# Patient Record
Sex: Female | Born: 1980
Health system: Southern US, Community
[De-identification: ages and names within clinical notes are randomized; demographics above are authoritative.]

## PROBLEM LIST (undated history)

## (undated) DIAGNOSIS — I509 Heart failure, unspecified: Secondary | ICD-10-CM

## (undated) DIAGNOSIS — G43909 Migraine, unspecified, not intractable, without status migrainosus: Secondary | ICD-10-CM

## (undated) DIAGNOSIS — T7840XA Allergy, unspecified, initial encounter: Secondary | ICD-10-CM

## (undated) DIAGNOSIS — I10 Essential (primary) hypertension: Secondary | ICD-10-CM

## (undated) DIAGNOSIS — D649 Anemia, unspecified: Secondary | ICD-10-CM

## (undated) HISTORY — PX: APPENDECTOMY: SHX54

## (undated) HISTORY — DX: Essential (primary) hypertension: I10

## (undated) HISTORY — DX: Allergy, unspecified, initial encounter: T78.40XA

## (undated) HISTORY — DX: Anemia, unspecified: D64.9

## (undated) HISTORY — PX: TUBAL LIGATION: SHX77

---
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
## 2000-01-25 ENCOUNTER — Emergency Department (HOSPITAL_COMMUNITY): Admission: EM | Admit: 2000-01-25 | Discharge: 2000-01-25 | Payer: Self-pay | Admitting: Emergency Medicine

## 2000-09-11 ENCOUNTER — Emergency Department (HOSPITAL_COMMUNITY): Admission: EM | Admit: 2000-09-11 | Discharge: 2000-09-11 | Payer: Self-pay | Admitting: Emergency Medicine

## 2002-11-25 ENCOUNTER — Emergency Department (HOSPITAL_COMMUNITY): Admission: EM | Admit: 2002-11-25 | Discharge: 2002-11-25 | Payer: Self-pay | Admitting: Emergency Medicine

## 2003-03-26 ENCOUNTER — Emergency Department (HOSPITAL_COMMUNITY): Admission: EM | Admit: 2003-03-26 | Discharge: 2003-03-26 | Payer: Self-pay | Admitting: Emergency Medicine

## 2005-01-18 ENCOUNTER — Emergency Department (HOSPITAL_COMMUNITY): Admission: EM | Admit: 2005-01-18 | Discharge: 2005-01-18 | Payer: Self-pay | Admitting: Emergency Medicine

## 2005-01-31 ENCOUNTER — Inpatient Hospital Stay (HOSPITAL_COMMUNITY): Admission: AD | Admit: 2005-01-31 | Discharge: 2005-01-31 | Payer: Self-pay | Admitting: Obstetrics

## 2005-02-19 ENCOUNTER — Inpatient Hospital Stay (HOSPITAL_COMMUNITY): Admission: AD | Admit: 2005-02-19 | Discharge: 2005-02-21 | Payer: Self-pay | Admitting: Obstetrics

## 2005-03-02 ENCOUNTER — Inpatient Hospital Stay (HOSPITAL_COMMUNITY): Admission: AD | Admit: 2005-03-02 | Discharge: 2005-03-07 | Payer: Self-pay | Admitting: Obstetrics

## 2006-05-14 DIAGNOSIS — I509 Heart failure, unspecified: Secondary | ICD-10-CM | POA: Insufficient documentation

## 2006-05-14 HISTORY — DX: Heart failure, unspecified: I50.9

## 2006-09-04 ENCOUNTER — Inpatient Hospital Stay (HOSPITAL_COMMUNITY): Admission: AD | Admit: 2006-09-04 | Discharge: 2006-09-04 | Payer: Self-pay | Admitting: Family Medicine

## 2006-09-05 ENCOUNTER — Ambulatory Visit: Payer: Self-pay | Admitting: Family Medicine

## 2006-09-05 ENCOUNTER — Encounter: Payer: Self-pay | Admitting: Family Medicine

## 2006-09-06 ENCOUNTER — Ambulatory Visit (HOSPITAL_COMMUNITY): Admission: RE | Admit: 2006-09-06 | Discharge: 2006-09-06 | Payer: Self-pay | Admitting: Family Medicine

## 2006-09-18 ENCOUNTER — Inpatient Hospital Stay (HOSPITAL_COMMUNITY): Admission: AD | Admit: 2006-09-18 | Discharge: 2006-09-21 | Payer: Self-pay | Admitting: Obstetrics and Gynecology

## 2006-09-20 ENCOUNTER — Encounter: Payer: Self-pay | Admitting: Gynecology

## 2006-09-26 ENCOUNTER — Ambulatory Visit: Payer: Self-pay | Admitting: Obstetrics & Gynecology

## 2010-04-22 ENCOUNTER — Emergency Department (HOSPITAL_COMMUNITY)
Admission: EM | Admit: 2010-04-22 | Discharge: 2010-04-22 | Payer: Self-pay | Source: Home / Self Care | Admitting: Emergency Medicine

## 2010-07-24 LAB — RAPID STREP SCREEN (MED CTR MEBANE ONLY): Streptococcus, Group A Screen (Direct): POSITIVE — AB

## 2010-09-26 NOTE — Discharge Summary (Signed)
NAME:  Jennifer Andrews, Jennifer Andrews               ACCOUNT NO.:  [id number]   MEDICAL RECORD NO.:  [id number]          PATIENT TYPE:  INP   LOCATION:                                FACILITY:  [facility]   PHYSICIAN:  Phil D. Okey Dupre, M.D.     DATE OF BIRTH:  [birth year]   DATE OF ADMISSION:  09/18/2006  DATE OF DISCHARGE:                               DISCHARGE SUMMARY   ATTENDING PHYSICIAN:  Phil D. Okey Dupre, M.D.   HISTORY OF PRESENT ILLNESS:  The patient is a 30 year old gravida 3,  para 2-0-0-2 at 20 weeks and 5 days with reports of severe lower  abdominal pain and difficulty urinating.  The patient was one week  status post an amniocentesis at the time of admission.  The patient also  reported episodes of diarrhea four times a day x1 week, and the patient  was admitted for IV hydration, pain management and further evaluation of  abdominal pain.  Renal ultrasound on May 7 appeared to be within normal  limits, and all of the patient's admission labs also appeared to be  within normal limits.  Amylase and lipase were slightly elevated at the  time of admission.  Lipase was 96 and amylase was 133.  Vital signs  remained stable and the patient was afebrile.  The patient did undergo  an MRI on hospital day #2 which revealed no significant abdominal or  pelvic findings per Dr. Loraine Leriche __________, radiologist.  The patient's  status improved significantly over the course of her hospital stay, and  she was discharged on hospital day #3, Sep 21, 2006, with improved  status and tolerating regular diet, denying nausea, vomiting and no  abdominal pain.   DISCHARGE DIAGNOSES:  A 30 year old gravida 3, para 2-0-0-2 at 21-2/7  weeks with abdominal pain of unknown etiology.  The patient was  discharged home and is going to continue to be followed by social work  and DSS secondary to living in a homeless shelter with her two other  children and will be followed up by GI and will continue her prenatal  care at [facility].   Was instructed to follow up as scheduled at  [facility] for her routine prenatal care.  OB/GYN office to call  patient with details of GI Outpatient follow up.      Maylon Cos, C.N.M.      Phil D. Okey Dupre, M.D.  Electronically Signed    SS/MEDQ  D:  11/19/2006  T:  11/20/2006  Job:  [id number]

## 2010-09-29 NOTE — Discharge Summary (Signed)
NAME:  Jennifer Andrews, Jennifer Andrews NO.:  [id number]   MEDICAL RECORD NO.:  [id number]          PATIENT TYPE:  INP   LOCATION:  9305                          FACILITY:  [facility]   PHYSICIAN:  Kathreen Cosier, M.D.DATE OF BIRTH:  1981-04-09   DATE OF ADMISSION:  03/02/2005  DATE OF DISCHARGE:  03/07/2005                                 DISCHARGE SUMMARY   Patient is a 30 year old gravida 2, para 1-0-0-1, EDC Sep 12, 2005 with a  third hospitalization where she was hospitalized October 9 to October 11  because of nausea, vomiting and was on steroid taper.  She finished her  steroid taper, now readmitted for the same hyperemesis.  On admission  hemoglobin was 14.2, white count 12.6, sodium 139, potassium 4, chloride  100, creatinine 0.6, glucose 93.  She was started on Ensure twice a day.  She tolerated this well and she also was started on a steroid taper.  Patient rapidly improved and by March 07, 2005 was discharged home on  Medrol steroid taper, Phenergan, and Pepcid, to see me in two weeks.   DISCHARGE DIAGNOSES:  Status post hospitalization for hyperemesis.           ______________________________  Kathreen Cosier, M.D.     BAM/MEDQ  D:  03/28/2005  T:  03/28/2005  Job:  [id number]

## 2010-09-29 NOTE — Discharge Summary (Signed)
Jennifer Andrews, FERGER NO.:  [id number]   MEDICAL RECORD NO.:  [id number]          PATIENT TYPE:  INP   LOCATION:  9311                          [facility]ACILITY:  [facility]   PHYSICIAN:  Kathreen Cosier, M.D.DATE OF BIRTH:  November 17, 1980   DATE OF ADMISSION:  02/19/2005  DATE OF DISCHARGE:  02/21/2005                                 DISCHARGE SUMMARY   Patient is a 30 year old gravida 2, para 1-0-0-1, EDC Sep 12, 2005.  Was  admitted with nausea, vomiting of pregnancy.  States that she vomited some  blood and had epigastric burning and pain.  Ketones were 40, protein 30 in  her urine.  Patient was started on IV antibiotics and IV fluids and her SMA-  24 was normal.  She had a urinary tract infection and was treated with  Macrobid twice a day.  She was also given Pepcid 20 mg daily which relieved  her symptoms.  She was started on Medrol steroid taper and rapidly felt  better.  She was discharged home on February 21, 2005 on the steroid taper  and Pepcid 20 mg p.o. daily and Macrobid b.i.d. for urinary tract infection  to see me in two weeks.   DISCHARGE DIAGNOSES:  Status post admission for hyperemesis.           ______________________________  Kathreen Cosier, M.D.     BAM/MEDQ  D:  03/28/2005  T:  03/28/2005  Job:  [id number]

## 2013-04-11 ENCOUNTER — Encounter (HOSPITAL_COMMUNITY): Payer: Self-pay | Admitting: Emergency Medicine

## 2013-04-11 ENCOUNTER — Emergency Department (HOSPITAL_COMMUNITY): Payer: Medicaid - Out of State

## 2013-04-11 ENCOUNTER — Emergency Department (HOSPITAL_COMMUNITY)
Admission: EM | Admit: 2013-04-11 | Discharge: 2013-04-11 | Disposition: A | Discharge status: Home / Self Care | Payer: Medicaid - Out of State | Attending: Emergency Medicine | Admitting: Emergency Medicine

## 2013-04-11 DIAGNOSIS — Z3202 Encounter for pregnancy test, result negative: Secondary | ICD-10-CM | POA: Insufficient documentation

## 2013-04-11 DIAGNOSIS — J22 Unspecified acute lower respiratory infection: Secondary | ICD-10-CM

## 2013-04-11 DIAGNOSIS — I509 Heart failure, unspecified: Secondary | ICD-10-CM | POA: Insufficient documentation

## 2013-04-11 DIAGNOSIS — N390 Urinary tract infection, site not specified: Secondary | ICD-10-CM

## 2013-04-11 DIAGNOSIS — F17200 Nicotine dependence, unspecified, uncomplicated: Secondary | ICD-10-CM | POA: Insufficient documentation

## 2013-04-11 DIAGNOSIS — R042 Hemoptysis: Secondary | ICD-10-CM | POA: Insufficient documentation

## 2013-04-11 DIAGNOSIS — J00 Acute nasopharyngitis [common cold]: Secondary | ICD-10-CM | POA: Insufficient documentation

## 2013-04-11 DIAGNOSIS — R630 Anorexia: Secondary | ICD-10-CM | POA: Insufficient documentation

## 2013-04-11 DIAGNOSIS — R52 Pain, unspecified: Secondary | ICD-10-CM | POA: Insufficient documentation

## 2013-04-11 HISTORY — DX: Heart failure, unspecified: I50.9

## 2013-04-11 LAB — URINALYSIS, ROUTINE W REFLEX MICROSCOPIC
Bilirubin Urine: NEGATIVE
Glucose, UA: NEGATIVE mg/dL
Hgb urine dipstick: NEGATIVE
Ketones, ur: NEGATIVE mg/dL
Nitrite: NEGATIVE
Protein, ur: NEGATIVE mg/dL
Specific Gravity, Urine: 1.028 (ref 1.005–1.030)
Urobilinogen, UA: 0.2 mg/dL (ref 0.0–1.0)
pH: 6.5 (ref 5.0–8.0)

## 2013-04-11 LAB — CBC WITH DIFFERENTIAL/PLATELET
Basophils Absolute: 0 10*3/uL (ref 0.0–0.1)
Basophils Relative: 0 % (ref 0–1)
Eosinophils Absolute: 0.2 10*3/uL (ref 0.0–0.7)
Eosinophils Relative: 2 % (ref 0–5)
HCT: 38.1 % (ref 36.0–46.0)
Hemoglobin: 13 g/dL (ref 12.0–15.0)
Lymphocytes Relative: 22 % (ref 12–46)
Lymphs Abs: 2.1 10*3/uL (ref 0.7–4.0)
MCH: 32.1 pg (ref 26.0–34.0)
MCHC: 34.1 g/dL (ref 30.0–36.0)
MCV: 94.1 fL (ref 78.0–100.0)
Monocytes Absolute: 0.7 10*3/uL (ref 0.1–1.0)
Monocytes Relative: 7 % (ref 3–12)
Neutro Abs: 6.7 10*3/uL (ref 1.7–7.7)
Neutrophils Relative %: 69 % (ref 43–77)
Platelets: 249 10*3/uL (ref 150–400)
RBC: 4.05 MIL/uL (ref 3.87–5.11)
RDW: 13.6 % (ref 11.5–15.5)
WBC: 9.7 10*3/uL (ref 4.0–10.5)

## 2013-04-11 LAB — BASIC METABOLIC PANEL
BUN: 8 mg/dL (ref 6–23)
CO2: 25 mEq/L (ref 19–32)
Calcium: 8.6 mg/dL (ref 8.4–10.5)
Chloride: 102 mEq/L (ref 96–112)
Creatinine, Ser: 0.65 mg/dL (ref 0.50–1.10)
GFR calc Af Amer: >90 mL/min (ref >90)
GFR calc non Af Amer: >90 mL/min (ref >90)
Glucose, Bld: 84 mg/dL (ref 70–99)
Potassium: 3.8 mEq/L (ref 3.5–5.1)
Sodium: 135 mEq/L (ref 135–145)

## 2013-04-11 LAB — PROTIME-INR
INR: 1.01 (ref 0.00–1.49)
Prothrombin Time: 13.1 seconds (ref 11.6–15.2)

## 2013-04-11 LAB — RAPID STREP SCREEN (MED CTR MEBANE ONLY): Streptococcus, Group A Screen (Direct): NEGATIVE

## 2013-04-11 LAB — URINE MICROSCOPIC-ADD ON

## 2013-04-11 LAB — POCT PREGNANCY, URINE: Preg Test, Ur: NEGATIVE

## 2013-04-11 MED ORDER — AMOXICILLIN 500 MG PO CAPS: 500.0000 mg | ORAL_CAPSULE | Freq: Three times a day (TID) | ORAL | Status: DC

## 2013-04-11 MED ORDER — HYDROCODONE-HOMATROPINE 5-1.5 MG/5ML PO SYRP: 5.0000 mL | ORAL_SOLUTION | Freq: Four times a day (QID) | ORAL | Status: DC | PRN

## 2013-04-11 MED ORDER — ACETAMINOPHEN 325 MG PO TABS
650.0000 mg | ORAL_TABLET | Freq: Once | ORAL | Status: AC
  Administered 2013-04-11: 650 mg via ORAL
  Filled 2013-04-11: qty 2

## 2013-04-11 NOTE — ED Notes (Signed)
Pt states that she has been having sore throat, body aches, and "spitting up big clots of blood and mucus".  When asked if they were streaks of blood or clots of blood, pt reiterated that they were "big clots" of blood.

## 2013-04-11 NOTE — ED Provider Notes (Signed)
CSN: [id number]     Arrival date & time 04/11/13  1100 History   First MD Initiated Contact with Patient 04/11/13 1110     Chief Complaint  Patient presents with  . Sore Throat  . Generalized Body Aches   (Consider location/radiation/quality/duration/timing/severity/associated sxs/prior Treatment) HPI This is a 32 year old female who presents emergency Department with chief complaint of sore throat, cough, hemoptysis, and myalgias.  Patient states that since Tuesday she has had generalized body aches, sore throat, decreased appetite, pain with cough, myalgias, and "coughing up chunks of blood."  She denies a hx of blood clots or pulmonary embolism.  She denies a family history of coagulopathy.  She denies any use of exogenous estrogens, recent confinement, surgery, unilateral leg swelling.  The patient is a current smoker.. States she had hemoptysis previously with a diagnosis of pneumonia.  She states that this is similar to her last episode of pneumonia.  The patient has been using TheraFlu at home without significant relief of her symptoms.  She denies fevers.  Past Medical History  Diagnosis Date  . CHF (congestive heart failure)    Past Surgical History  Procedure Laterality Date  . Cesarean section    . Appendectomy     History reviewed. No pertinent family history. History  Substance Use Topics  . Smoking status: Current Every Day Smoker  . Smokeless tobacco: Not on file  . Alcohol Use: No   OB History   Grav Para Term Preterm Abortions TAB SAB Ect Mult Living                 Review of Systems  Ten systems reviewed and are negative for acute change, except as noted in the HPI.   Allergies  Review of patient's allergies indicates no known allergies.  Home Medications   Current Outpatient Rx  Name  Route  Sig  Dispense  Refill  . Pheniramine-PE-APAP (THERAFLU COLD & SORE THROAT) 20-10-325 MG PACK   Oral   Take 1 packet by mouth every 6 (six) hours as needed (cold  symptoms).          BP 127/81  Pulse 103  Temp(Src) 98.1 F (36.7 C) (Oral)  Resp 16  SpO2 99%  LMP 03/21/2013 Physical Exam Appears moderately ill but not toxic; temperature as noted in vitals. Ears normal. Eyes:glassy appearance, no discharge  Heart: RRR, NO M/G/R Throat and pharynx normal.   Neck supple. No adenopathyhy in the neck.  Sinuses non tender.  The chest is clear. Abdomen is soft and nontender.  ED Course  Procedures (including critical care time) Labs Review Labs Reviewed  RAPID STREP SCREEN  CULTURE, GROUP A STREP  CBC WITH DIFFERENTIAL  BASIC METABOLIC PANEL  PROTIME-INR  URINALYSIS, ROUTINE W REFLEX MICROSCOPIC  POCT PREGNANCY, URINE   Imaging Review No results found.  EKG Interpretation   None       MDM   1. Chest cold    12:26 PM BP 127/81  Pulse 103  Temp(Src) 98.1 F (36.7 C) (Oral)  Resp 16  SpO2 99%  LMP 03/21/2013 Pt CXR negative for acute infiltrate. Patients symptoms are consistent with URI, likely viral etiology. Discussed that antibiotics are not indicated for viral infections. Pt will be discharged with symptomatic treatment.  Verbalizes understanding and is agreeable with plan. Pt is hemodynamically stable & in NAD prior to dc.   1:05 PM Patient UA positive for UTI. Will treat with amoxil b/c of dual respiratory coverage. The patient appears reasonably  screened and/or stabilized for discharge and I doubt any other medical condition or other [facility] requiring further screening, evaluation, or treatment in the ED at this time prior to discharge.    Arthor Captain, PA-C 04/12/13 [phone]

## 2013-04-13 LAB — URINE CULTURE: Colony Count: 80000

## 2013-04-13 LAB — CULTURE, GROUP A STREP

## 2013-04-13 NOTE — ED Provider Notes (Signed)
Medical screening examination/treatment/procedure(s) were performed by non-physician practitioner and as supervising physician I was immediately available for consultation/collaboration.  EKG Interpretation   None         Xabi Wittler, MD 04/13/13 0731 

## 2013-04-14 ENCOUNTER — Telehealth (HOSPITAL_COMMUNITY): Payer: Self-pay | Admitting: Emergency Medicine

## 2013-04-14 NOTE — ED Notes (Signed)
Post ED Visit - Positive Culture Follow-up  Culture report reviewed by antimicrobial stewardship pharmacist: []  Wes Dulaney, Pharm.D., BCPS [x]  Celedonio Miyamoto, Pharm.D., BCPS []  Georgina Pillion, Pharm.D., BCPS []  [place], [address].D., BCPS, AAHIVP []  Estella Husk, Pharm.D., BCPS, AAHIVP  Positive strep culture Treated with Amoxicillin, organism sensitive to the same and no further patient follow-up is required at this time.  Kylie A Holland 04/14/2013, 1:45 PM

## 2013-04-20 ENCOUNTER — Encounter (HOSPITAL_COMMUNITY): Payer: Self-pay | Admitting: Emergency Medicine

## 2013-04-20 ENCOUNTER — Emergency Department (HOSPITAL_COMMUNITY)
Admission: EM | Admit: 2013-04-20 | Discharge: 2013-04-20 | Disposition: A | Discharge status: Home / Self Care | Payer: Medicaid - Out of State | Attending: Emergency Medicine | Admitting: Emergency Medicine

## 2013-04-20 DIAGNOSIS — IMO0001 Reserved for inherently not codable concepts without codable children: Secondary | ICD-10-CM | POA: Insufficient documentation

## 2013-04-20 DIAGNOSIS — Z3202 Encounter for pregnancy test, result negative: Secondary | ICD-10-CM | POA: Insufficient documentation

## 2013-04-20 DIAGNOSIS — R197 Diarrhea, unspecified: Secondary | ICD-10-CM | POA: Insufficient documentation

## 2013-04-20 DIAGNOSIS — M129 Arthropathy, unspecified: Secondary | ICD-10-CM | POA: Insufficient documentation

## 2013-04-20 DIAGNOSIS — I509 Heart failure, unspecified: Secondary | ICD-10-CM | POA: Insufficient documentation

## 2013-04-20 DIAGNOSIS — K226 Gastro-esophageal laceration-hemorrhage syndrome: Secondary | ICD-10-CM

## 2013-04-20 DIAGNOSIS — K921 Melena: Secondary | ICD-10-CM | POA: Insufficient documentation

## 2013-04-20 DIAGNOSIS — F17200 Nicotine dependence, unspecified, uncomplicated: Secondary | ICD-10-CM | POA: Insufficient documentation

## 2013-04-20 DIAGNOSIS — R112 Nausea with vomiting, unspecified: Secondary | ICD-10-CM | POA: Insufficient documentation

## 2013-04-20 DIAGNOSIS — K625 Hemorrhage of anus and rectum: Secondary | ICD-10-CM | POA: Insufficient documentation

## 2013-04-20 LAB — URINALYSIS, ROUTINE W REFLEX MICROSCOPIC
Bilirubin Urine: NEGATIVE
Glucose, UA: NEGATIVE mg/dL
Leukocytes, UA: NEGATIVE
Nitrite: NEGATIVE
Specific Gravity, Urine: 1.026 (ref 1.005–1.030)
Urobilinogen, UA: 1 mg/dL (ref 0.0–1.0)
pH: 6 (ref 5.0–8.0)

## 2013-04-20 LAB — CBC WITH DIFFERENTIAL/PLATELET
Basophils Absolute: 0 10*3/uL (ref 0.0–0.1)
Basophils Relative: 0 % (ref 0–1)
Hemoglobin: 13.9 g/dL (ref 12.0–15.0)
MCHC: 34.2 g/dL (ref 30.0–36.0)
Monocytes Relative: 4 % (ref 3–12)
Neutro Abs: 8.6 10*3/uL — ABNORMAL HIGH (ref 1.7–7.7)
Neutrophils Relative %: 86 % — ABNORMAL HIGH (ref 43–77)
RBC: 4.36 MIL/uL (ref 3.87–5.11)

## 2013-04-20 LAB — BASIC METABOLIC PANEL
BUN: 10 mg/dL (ref 6–23)
CO2: 22 mEq/L (ref 19–32)
Chloride: 100 mEq/L (ref 96–112)
Creatinine, Ser: 0.72 mg/dL (ref 0.50–1.10)
GFR calc Af Amer: >90 mL/min (ref >90)
Glucose, Bld: 92 mg/dL (ref 70–99)
Potassium: 3.9 mEq/L (ref 3.5–5.1)

## 2013-04-20 LAB — OCCULT BLOOD, POC DEVICE: Fecal Occult Bld: NEGATIVE

## 2013-04-20 LAB — CK: Total CK: 200 U/L — ABNORMAL HIGH (ref 7–177)

## 2013-04-20 LAB — POCT PREGNANCY, URINE: Preg Test, Ur: NEGATIVE

## 2013-04-20 MED ORDER — KETOROLAC TROMETHAMINE 30 MG/ML IJ SOLN
30.0000 mg | Freq: Once | INTRAMUSCULAR | Status: AC
  Administered 2013-04-20: 30 mg via INTRAVENOUS
  Filled 2013-04-20: qty 1

## 2013-04-20 MED ORDER — SODIUM CHLORIDE 0.9 % IV BOLUS (SEPSIS)
1000.0000 mL | Freq: Once | INTRAVENOUS | Status: AC
  Administered 2013-04-20: 1000 mL via INTRAVENOUS

## 2013-04-20 MED ORDER — FAMOTIDINE IN NACL 20-0.9 MG/50ML-% IV SOLN
20.0000 mg | Freq: Once | INTRAVENOUS | Status: AC
Start: 2013-04-20 — End: 2013-04-20
  Administered 2013-04-20: 20 mg via INTRAVENOUS
  Filled 2013-04-20: qty 50

## 2013-04-20 MED ORDER — MORPHINE SULFATE 4 MG/ML IJ SOLN
4.0000 mg | Freq: Once | INTRAMUSCULAR | Status: AC
  Administered 2013-04-20: 4 mg via INTRAVENOUS
  Filled 2013-04-20: qty 1

## 2013-04-20 MED ORDER — IBUPROFEN 400 MG PO TABS: 400.0000 mg | ORAL_TABLET | Freq: Four times a day (QID) | ORAL | Status: DC | PRN

## 2013-04-20 MED ORDER — HYDROCODONE-ACETAMINOPHEN 5-325 MG PO TABS: 1.0000 | ORAL_TABLET | Freq: Four times a day (QID) | ORAL | Status: DC | PRN

## 2013-04-20 MED ORDER — ONDANSETRON 8 MG PO TBDP: 8.0000 mg | ORAL_TABLET | Freq: Three times a day (TID) | ORAL | Status: DC | PRN

## 2013-04-20 NOTE — ED Notes (Signed)
Patient willl call when she has to urinate

## 2013-04-20 NOTE — ED Notes (Signed)
MD at bedside. 

## 2013-04-20 NOTE — ED Notes (Addendum)
patient reports body aches, N/V/D and states she had a bright red clot in her emesis. Patient also states she had a burgundy-colored blood that was in the stool. Patient c/o bilateral feet swelling.

## 2013-04-20 NOTE — Progress Notes (Signed)
[facility] provided pt with a list of primary care resources. Patient stated pending Medicaid.  °

## 2013-04-20 NOTE — ED Notes (Signed)
Patient has tried to urinate several times and can not.

## 2013-04-20 NOTE — ED Provider Notes (Signed)
CSN: [id number]     Arrival date & time 04/20/13  0820 History   First MD Initiated Contact with Patient 04/20/13 [phone]     Chief Complaint  Patient presents with  . Generalized Body Aches  . Emesis  . Rectal Bleeding   (Consider location/radiation/quality/duration/timing/severity/associated sxs/prior Treatment) HPI Comments: Pt with no medical hx comes in with cc of diffuse body aches and emesis, diarrhea, with blood in it. Pt reports feeling well until last evening, when she suddenly started having pain all over her body. She subsequently has had about 2-3 episodes of loose BM and emesis - with the emesis having some blood in it, and possibly her stool having blood in it. She has no hx of liver dz, gastritis - and no risk factors for gastritis or alcohol abuse. No fevers, chill, no uri like sx, no new meds, drug use, supplemental drug use. No recent strenuous activities.  Patient is a 32 y.o. female presenting with vomiting and hematochezia. The history is provided by the patient.  Emesis Associated symptoms: arthralgias, diarrhea and myalgias   Associated symptoms: no abdominal pain and no headaches   Rectal Bleeding Associated symptoms: vomiting   Associated symptoms: no abdominal pain and no fever     Past Medical History  Diagnosis Date  . CHF (congestive heart failure)    Past Surgical History  Procedure Laterality Date  . Cesarean section    . Appendectomy     Family History  Problem Relation Age of Onset  . Asthma Mother   . Stroke Father   . Diabetes Father   . Heart failure Father    History  Substance Use Topics  . Smoking status: Current Some Day Smoker    Types: Cigars  . Smokeless tobacco: Never Used  . Alcohol Use: No   OB History   Grav Para Term Preterm Abortions TAB SAB Ect Mult Living                 Review of Systems  Constitutional: Positive for activity change. Negative for fever.  Respiratory: Negative for shortness of breath.    Cardiovascular: Negative for chest pain.  Gastrointestinal: Positive for nausea, vomiting, diarrhea, blood in stool and hematochezia. Negative for abdominal pain.  Genitourinary: Negative for dysuria.  Musculoskeletal: Positive for arthralgias, back pain and myalgias. Negative for neck pain.  Neurological: Negative for headaches.    Allergies  Review of patient's allergies indicates no known allergies.  Home Medications  No current outpatient prescriptions on file. BP 118/67  Pulse 71  Temp(Src) 99 F (37.2 C) (Oral)  Resp 19  SpO2 99%  LMP 03/21/2013 Physical Exam  Nursing note and vitals reviewed. Constitutional: She is oriented to person, place, and time. She appears well-developed.  HENT:  Head: Normocephalic and atraumatic.  Eyes: Conjunctivae and EOM are normal. Pupils are equal, round, and reactive to light.  Neck: Normal range of motion. Neck supple.  Cardiovascular: Normal rate, regular rhythm and normal heart sounds.   Pulmonary/Chest: Effort normal and breath sounds normal. No respiratory distress. She exhibits tenderness.  Abdominal: Soft. Bowel sounds are normal. She exhibits no distension. There is tenderness. There is no rebound and no guarding.  Musculoskeletal: She exhibits tenderness.  Neurological: She is alert and oriented to person, place, and time.  Skin: Skin is warm and dry.    ED Course  Procedures (including critical care time) Labs Review Labs Reviewed  CBC WITH DIFFERENTIAL - Abnormal; Notable for the following:  Neutrophils Relative % 86 (*)    Neutro Abs 8.6 (*)    Lymphocytes Relative 10 (*)    All other components within normal limits  BASIC METABOLIC PANEL - Abnormal; Notable for the following:    Sodium 132 (*)    All other components within normal limits  CK - Abnormal; Notable for the following:    Total CK 200 (*)    All other components within normal limits  URINALYSIS, ROUTINE W REFLEX MICROSCOPIC  OCCULT BLOOD, POC DEVICE    Imaging Review No results found.  EKG Interpretation   None       MDM  No diagnosis found.  Pt comes in with cc of diffuse body aches, n/v/d.  Pt is hemoccult neg, no melena, and there has been no emesis in the ER. Hb is stable. There is no risk factor for gastritis. Will give GI f/u if the sx persist, she can see them.  Pt has diffuse body aches and malaise, with no uri like sx. CK is slightly elevated - so possible some myositis. Likely some viral syndrome, causing diffuse aches. Her vitals are stable and she has 0 SIRs criteria. We have hydrated her in the ER and feel that she can go home, get some rest, get fluids and probably will get better overtime. Pt to return to the ER if the sx get worse.   Derwood Kaplan, MD 04/20/13 1208

## 2013-09-01 ENCOUNTER — Emergency Department (HOSPITAL_COMMUNITY)
Admission: EM | Admit: 2013-09-01 | Discharge: 2013-09-02 | Disposition: A | Discharge status: Home / Self Care | Payer: Medicaid Other | Attending: Emergency Medicine | Admitting: Emergency Medicine

## 2013-09-01 ENCOUNTER — Encounter (HOSPITAL_COMMUNITY): Payer: Self-pay | Admitting: Emergency Medicine

## 2013-09-01 DIAGNOSIS — R519 Headache, unspecified: Secondary | ICD-10-CM

## 2013-09-01 DIAGNOSIS — I509 Heart failure, unspecified: Secondary | ICD-10-CM | POA: Insufficient documentation

## 2013-09-01 DIAGNOSIS — R51 Headache: Secondary | ICD-10-CM

## 2013-09-01 DIAGNOSIS — F17200 Nicotine dependence, unspecified, uncomplicated: Secondary | ICD-10-CM | POA: Insufficient documentation

## 2013-09-01 DIAGNOSIS — G43909 Migraine, unspecified, not intractable, without status migrainosus: Secondary | ICD-10-CM | POA: Insufficient documentation

## 2013-09-01 HISTORY — DX: Migraine, unspecified, not intractable, without status migrainosus: G43.909

## 2013-09-01 MED ORDER — DIPHENHYDRAMINE HCL 50 MG/ML IJ SOLN
25.0000 mg | Freq: Once | INTRAMUSCULAR | Status: AC
  Administered 2013-09-02: 25 mg via INTRAVENOUS
  Filled 2013-09-01: qty 1

## 2013-09-01 MED ORDER — KETOROLAC TROMETHAMINE 30 MG/ML IJ SOLN
30.0000 mg | Freq: Once | INTRAMUSCULAR | Status: AC
  Administered 2013-09-02: 30 mg via INTRAVENOUS
  Filled 2013-09-01: qty 2
  Filled 2013-09-01: qty 1

## 2013-09-01 MED ORDER — METOCLOPRAMIDE HCL 5 MG/ML IJ SOLN
10.0000 mg | Freq: Once | INTRAMUSCULAR | Status: AC
  Administered 2013-09-02: 10 mg via INTRAVENOUS
  Filled 2013-09-01: qty 2

## 2013-09-01 NOTE — ED Notes (Signed)
Pt states that she began having a migraine around 3pm today; pt states that she is nauseous with the migraine; pt states that she has had migraines in the past; pt denies visual changes, photophobia or any other associated symptoms.

## 2013-09-02 NOTE — ED Provider Notes (Signed)
CSN: [id number]     Arrival date & time 09/01/13  2146 History   First MD Initiated Contact with Patient 09/01/13 2310     Chief Complaint  Patient presents with  . Migraine     (Consider location/radiation/quality/duration/timing/severity/associated sxs/prior Treatment) Patient is a 33 y.o. female presenting with migraines. The history is provided by the patient and medical records.  Migraine Associated symptoms include headaches.   This is a 33 year old female with past medical history significant for congestive heart failure and migraines, presenting to the ED for migraine headaches. States headache started at approximately 1500 while picking up her children from school has been progressively worsening throughout the day. Headache described as a generalized throbbing sensation across her for head associated with photophobia and nausea. Patient denies vomiting. No associated aura, tinnitus, visual disturbance, confusion, changes in speech, unilateral weakness, numbness, paresthesias, or difficulty ambulating.  No fevers, chills, or neck pain.  Patient was previously followed by a neurologist as a teenager, but has not seen one in several years. She is now currently on maintenance medications for migraines. She did take Excedrin Migraine prior to arrival without significant improvement.  VS stable on arrival.    Past Medical History  Diagnosis Date  . CHF (congestive heart failure)   . Migraines    Past Surgical History  Procedure Laterality Date  . Cesarean section    . Appendectomy     Family History  Problem Relation Age of Onset  . Asthma Mother   . Stroke Father   . Diabetes Father   . Heart failure Father    History  Substance Use Topics  . Smoking status: Current Some Day Smoker    Types: Cigars  . Smokeless tobacco: Never Used  . Alcohol Use: No   OB History   Grav Para Term Preterm Abortions TAB SAB Ect Mult Living                 Review of Systems  Eyes:  Positive for photophobia.  Neurological: Positive for headaches.  All other systems reviewed and are negative.     Allergies  Review of patient's allergies indicates no known allergies.  Home Medications   Prior to Admission medications   Medication Sig Start Date End Date Taking? Authorizing Provider  aspirin-acetaminophen-caffeine (EXCEDRIN MIGRAINE) [id number] 0654250-250-65 MG per tablet Take 1 tablet by mouth every 6 (six) hours as needed for headache.   Yes Historical Provider, MD   BP 119/72  Pulse 74  Temp(Src) 97.6 F (36.4 C) (Oral)  Resp 20  Ht 5' (1.524 m)  Wt 218 lb (98.884 kg)  BMI 42.58 kg/m2  SpO2 100%  LMP 08/17/2013  Physical Exam  Nursing note and vitals reviewed. Constitutional: She is oriented to person, place, and time. She appears well-developed and well-nourished. No distress.  Lying in dark room, hands over forehead and eyes  HENT:  Head: Normocephalic and atraumatic.  Mouth/Throat: Oropharynx is clear and moist.  Eyes: Conjunctivae and EOM are normal. Pupils are equal, round, and reactive to light.  Neck: Normal range of motion and full passive range of motion without pain. Neck supple. No rigidity. No edema present.  No meningeal signs; full ROM without pain  Cardiovascular: Normal rate, regular rhythm and normal heart sounds.   Pulmonary/Chest: Effort normal and breath sounds normal. No respiratory distress. She has no wheezes.  Musculoskeletal: Normal range of motion.  Neurological: She is alert and oriented to person, place, and time. She has normal strength. She displays  no tremor. No cranial nerve deficit or sensory deficit. She displays no seizure activity.  AAOx3, answering questions and following commands appropriately; equal strength UE and LE bilaterally; CN grossly intact; moves all extremities appropriately without ataxia; no focal neuro deficits or facial asymmetry appreciated  Skin: Skin is warm and dry. She is not diaphoretic.  Psychiatric: She  has a normal mood and affect.    ED Course  Procedures (including critical care time) Labs Review Labs Reviewed - No data to display  Imaging Review No results found.   EKG Interpretation None      MDM   Final diagnoses:  Headache   33 y.o. F with hx of migraines presenting today with migraine headache.  Associated photophobia and nausea, no vomiting.  No fevers, chills, or neck pain.  Patient is afebrile and overall nontoxic appearing.  Neuro exam is intact and without focal deficit.  Low suspicion for TIA, stroke, ICH, SAH, or meningitis.  Will give migraine cocktail and reassess.  Pt reassessed, currently sleeping, NAD.  Awoke pt, states headache has improved.  Neuro exam remains intact.  Pt discharged home.  Will follow-up with PCP if problems occur.  Discussed plan with pt, she acknowledged understanding and agreed with plan of care.  Return precautions given for new or worsening symptoms.  Garlon HatchetLisa M Rogelio Waynick, PA-C 09/02/13 0111

## 2013-09-02 NOTE — Discharge Instructions (Signed)
Follow-up with your primary care physician. °Return to the ED for new or worsening symptoms. °

## 2013-09-02 NOTE — ED Provider Notes (Signed)
Medical screening examination/treatment/procedure(s) were performed by non-physician practitioner and as supervising physician I was immediately available for consultation/collaboration.   EKG Interpretation None        Courtney F Horton, MD 09/02/13 1859 

## 2014-05-02 ENCOUNTER — Emergency Department (HOSPITAL_COMMUNITY)
Admission: EM | Admit: 2014-05-02 | Discharge: 2014-05-02 | Disposition: A | Discharge status: Home / Self Care | Payer: Medicaid Other | Attending: Emergency Medicine | Admitting: Emergency Medicine

## 2014-05-02 ENCOUNTER — Encounter (HOSPITAL_COMMUNITY): Payer: Self-pay | Admitting: Emergency Medicine

## 2014-05-02 DIAGNOSIS — Y9389 Activity, other specified: Secondary | ICD-10-CM | POA: Diagnosis not present

## 2014-05-02 DIAGNOSIS — Y998 Other external cause status: Secondary | ICD-10-CM | POA: Diagnosis not present

## 2014-05-02 DIAGNOSIS — X58XXXA Exposure to other specified factors, initial encounter: Secondary | ICD-10-CM | POA: Diagnosis not present

## 2014-05-02 DIAGNOSIS — Z87891 Personal history of nicotine dependence: Secondary | ICD-10-CM | POA: Diagnosis not present

## 2014-05-02 DIAGNOSIS — Y9289 Other specified places as the place of occurrence of the external cause: Secondary | ICD-10-CM | POA: Diagnosis not present

## 2014-05-02 DIAGNOSIS — I509 Heart failure, unspecified: Secondary | ICD-10-CM | POA: Diagnosis not present

## 2014-05-02 DIAGNOSIS — R000 Tachycardia, unspecified: Secondary | ICD-10-CM | POA: Insufficient documentation

## 2014-05-02 DIAGNOSIS — G43909 Migraine, unspecified, not intractable, without status migrainosus: Secondary | ICD-10-CM | POA: Insufficient documentation

## 2014-05-02 DIAGNOSIS — S39012A Strain of muscle, fascia and tendon of lower back, initial encounter: Secondary | ICD-10-CM | POA: Diagnosis not present

## 2014-05-02 DIAGNOSIS — S3992XA Unspecified injury of lower back, initial encounter: Secondary | ICD-10-CM | POA: Diagnosis present

## 2014-05-02 MED ORDER — NAPROXEN 375 MG PO TABS: 375.0000 mg | ORAL_TABLET | Freq: Two times a day (BID) | ORAL | Status: DC | PRN

## 2014-05-02 MED ORDER — KETOROLAC TROMETHAMINE 60 MG/2ML IM SOLN
60.0000 mg | Freq: Once | INTRAMUSCULAR | Status: AC
  Administered 2014-05-02: 60 mg via INTRAMUSCULAR
  Filled 2014-05-02: qty 2

## 2014-05-02 MED ORDER — DIAZEPAM 5 MG PO TABS: 5.0000 mg | ORAL_TABLET | Freq: Three times a day (TID) | ORAL | Status: DC | PRN

## 2014-05-02 NOTE — ED Notes (Addendum)
Pt c/o lower back pain and "pulling sensation" to left posterior thigh. Pt denies loss of control of bowel or bladder, denies trauma.

## 2014-05-02 NOTE — ED Provider Notes (Signed)
CSN: [id number]     Arrival date & time 05/02/14  [id number] History   First MD Initiated Contact with Patient 05/02/14 0801     Chief Complaint  Patient presents with  . Back Pain     (Consider location/radiation/quality/duration/timing/severity/associated sxs/prior Treatment) HPI Comments: 33 year old female with congestive heart failure history, very mild per patient not on medicines, no allergies presents with left lower back pain with mild radiation down buttocks. Patient denies urinary or bowel changes, active cancer, extremity weakness, IVDU, fevers, immunosuppression or significant trauma. 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 Symptoms gradually worsening the past 2 days, patient works at [employer] regularly.   Patient is a 33 y.o. female presenting with back pain. The history is provided by the patient.  Back Pain Associated symptoms: no abdominal pain, no dysuria, no fever, no headaches, no numbness and no weakness     Past Medical History  Diagnosis Date  . CHF (congestive heart failure)   . Migraines    Past Surgical History  Procedure Laterality Date  . Cesarean section    . Appendectomy     Family History  Problem Relation Age of Onset  . Asthma Mother   . Stroke Father   . Diabetes Father   . Heart failure Father    History  Substance Use Topics  . Smoking status: Former Smoker    Types: Cigars  . Smokeless tobacco: Never Used  . Alcohol Use: No   OB History    No data available     Review of Systems  Constitutional: Negative for fever and chills.  Eyes: Negative for visual disturbance.  Gastrointestinal: Negative for vomiting and abdominal pain.  Genitourinary: Negative for dysuria and flank pain.  Musculoskeletal: Positive for back pain. Negative for neck pain and neck stiffness.  Skin: Negative for rash.  Neurological: Negative for weakness, light-headedness, numbness and headaches.      Allergies  Review  of patient's allergies indicates no known allergies.  Home Medications   Prior to Admission medications   Medication Sig Start Date End Date Taking? Authorizing Provider  aspirin-acetaminophen-caffeine (EXCEDRIN MIGRAINE) [phone] MG per tablet Take 1 tablet by mouth every 6 (six) hours as needed for headache.    Historical Provider, MD   BP 120/84 mmHg  Pulse 107  Temp(Src) 97.8 F (36.6 C) (Oral)  Resp 18  SpO2 100% Physical Exam  Constitutional: She is oriented to person, place, and time. She appears well-developed and well-nourished.  HENT:  Head: Normocephalic and atraumatic.  Eyes: Right eye exhibits no discharge. Left eye exhibits no discharge.  Cardiovascular: Regular rhythm.  Tachycardia present.   Pulmonary/Chest: Effort normal and breath sounds normal.  Musculoskeletal: She exhibits tenderness. She exhibits no edema.  Patient has paraspinal lower lumbar and sacral tenderness worse with palpation and extension.  Neurological: She is alert and oriented to person, place, and time.  Reflex Scores:      Patellar reflexes are 2+ on the right side and 2+ on the left side.      Achilles reflexes are 2+ on the right side and 2+ on the left side. 5+ strength in  LE with f/e at major joints. Sensation to palpation intact in UE and LE. CNs 2-12 grossly intact.     Skin: Skin is warm. No rash noted.  Psychiatric: She has a normal mood and affect.  Nursing note and vitals reviewed.   ED Course  Procedures (including critical care time)  Labs Review Labs Reviewed - No data to display  Imaging Review No results found.   EKG Interpretation None      MDM   Final diagnoses:  Lumbosacral strain, initial encounter   Patient with minimal medical history, no red flags for back workup presents with clinically muscular skeletal back pain. Discussed supportive care, few days off work, avoiding mechanism that worsens pain, muscle relaxant and pain medicines. Discussed reasons  to return. Patient says she has no issues with NSAIDs, no kidney disease, will give Toradol in ER.  Results and differential diagnosis were discussed with the patient/parent/guardian. Close follow up outpatient was discussed, comfortable with the plan.   Medications  ketorolac (TORADOL) injection 60 mg (not administered)    Filed Vitals:   05/02/14 0804 05/02/14 0805  BP: 120/84   Pulse: 107   Temp:  97.8 F (36.6 C)  TempSrc:  Oral  Resp:  18  SpO2: 100%     Final diagnoses:  Lumbosacral strain, initial encounter       Enid SkeensJoshua M Wes Lezotte, MD 05/02/14 [phone]

## 2014-05-02 NOTE — ED Notes (Signed)
MD at bedside. 

## 2014-05-02 NOTE — Discharge Instructions (Signed)
If you were given medicines take as directed.  If you are on coumadin or contraceptives realize their levels and effectiveness is altered by many different medicines.  If you have any reaction (rash, tongues swelling, other) to the medicines stop taking and see a physician.   Please follow up as directed and return to the ER or see a physician for new or worsening symptoms (weakness, numbness, urinary changes).  Thank you. Filed Vitals:   05/02/14 0804 05/02/14 0805  BP: 120/84   Pulse: 107   Temp:  97.8 F (36.6 C)  TempSrc:  Oral  Resp:  18  SpO2: 100%

## 2014-05-02 NOTE — ED Notes (Signed)
Pt alert, oriented, and ambulatory upon DC. She is aware to follow up with PCP in 1 week regarding physical therapy recommendation. Pt leaves with DC papers in hand.

## 2015-01-12 ENCOUNTER — Encounter (HOSPITAL_COMMUNITY): Payer: Self-pay | Admitting: Emergency Medicine

## 2015-01-12 ENCOUNTER — Emergency Department (HOSPITAL_COMMUNITY)
Admission: EM | Admit: 2015-01-12 | Discharge: 2015-01-13 | Disposition: A | Discharge status: Home / Self Care | Payer: Medicaid Other | Attending: Emergency Medicine | Admitting: Emergency Medicine

## 2015-01-12 DIAGNOSIS — Z79899 Other long term (current) drug therapy: Secondary | ICD-10-CM | POA: Diagnosis not present

## 2015-01-12 DIAGNOSIS — M7989 Other specified soft tissue disorders: Secondary | ICD-10-CM | POA: Diagnosis not present

## 2015-01-12 DIAGNOSIS — Z720 Tobacco use: Secondary | ICD-10-CM | POA: Insufficient documentation

## 2015-01-12 DIAGNOSIS — I509 Heart failure, unspecified: Secondary | ICD-10-CM | POA: Insufficient documentation

## 2015-01-12 DIAGNOSIS — M25561 Pain in right knee: Secondary | ICD-10-CM | POA: Diagnosis not present

## 2015-01-12 DIAGNOSIS — Z8679 Personal history of other diseases of the circulatory system: Secondary | ICD-10-CM | POA: Diagnosis not present

## 2015-01-12 MED ORDER — IBUPROFEN 800 MG PO TABS
800.0000 mg | ORAL_TABLET | Freq: Once | ORAL | Status: DC
  Filled 2015-01-12: qty 1

## 2015-01-12 MED ORDER — MELOXICAM 7.5 MG PO TABS
7.5000 mg | ORAL_TABLET | Freq: Every day | ORAL | Status: DC
Start: 2015-01-12 — End: 2017-01-21

## 2015-01-12 MED ORDER — HYDROCODONE-ACETAMINOPHEN 5-325 MG PO TABS
1.0000 | ORAL_TABLET | Freq: Once | ORAL | Status: AC
  Administered 2015-01-13: 1 via ORAL
  Filled 2015-01-12: qty 1

## 2015-01-12 NOTE — ED Notes (Signed)
Pt c/o right knee pain x1 month, radiating to lower back . Reports numbness and tingling traveling to toes. Ambulatory with steady gait. No relief with tylenol and ibuprofen at home.

## 2015-01-12 NOTE — ED Provider Notes (Signed)
CSN: [id number]     Arrival date & time 01/12/15  2246 History  This chart was scribed for Elpidio Anis, working with Loren Racer, MD by Chestine Spore, ED Scribe. The patient was seen in room WTR6/WTR6 at 11:13 PM.    No chief complaint on file.    The history is provided by the patient. No language interpreter was used.    Jennifer Andrews is a 34 y.o. female who presents to the Emergency Department complaining of progressively worsening right knee pain onset since April, 2016. Pt reports that her right knee pain has been going on for 1 month. Pt has not been informed that she has arthritis in the past. Pt reports that she is able to bear weight but not without aid from her son. Pt is having associated symptoms of right knee swelling with intermittent left knee swelling. She notes that she has tried ibuprofen and tylenol with no relief of her symptoms. She denies color change, wound, rash, and any other symptoms. 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 Denies allergies to medications.   Past Medical History  Diagnosis Date  . CHF (congestive heart failure)   . Migraines    Past Surgical History  Procedure Laterality Date  . Cesarean section    . Appendectomy     Family History  Problem Relation Age of Onset  . Asthma Mother   . Stroke Father   . Diabetes Father   . Heart failure Father    Social History  Substance Use Topics  . Smoking status: Current Some Day Smoker    Types: Cigarettes  . Smokeless tobacco: Never Used  . Alcohol Use: No   OB History    No data available     Review of Systems  Musculoskeletal: Positive for joint swelling and arthralgias. Negative for gait problem.  Skin: Negative for color change, rash and wound.      Allergies  Review of patient's allergies indicates no known allergies.  Home Medications   Prior to Admission medications   Medication Sig Start Date End Date Taking? Authorizing Provider  chlorhexidine  (PERIDEX) 0.12 % solution 15 mLs. Rinse for 30 seconds twice daily after breakfast and before bed. 12/16/14  Yes Historical Provider, MD  ibuprofen (ADVIL,MOTRIN) 800 MG tablet Take 1 tablet by mouth every 6 (six) hours as needed. pain 12/20/14  Yes Historical Provider, MD  traMADol (ULTRAM) 50 MG tablet Take 50 mg by mouth every 6 (six) hours as needed for moderate pain.   Yes Historical Provider, MD  diazepam (VALIUM) 5 MG tablet Take 1 tablet (5 mg total) by mouth every 8 (eight) hours as needed for muscle spasms. Patient not taking: Reported on 01/12/2015 05/02/14   Blane Ohara, MD  naproxen (NAPROSYN) 375 MG tablet Take 1 tablet (375 mg total) by mouth 2 (two) times daily as needed. Patient not taking: Reported on 01/12/2015 05/02/14   Blane Ohara, MD   BP 126/69 mmHg  Pulse 77  Temp(Src) 97.8 F (36.6 C) (Oral)  Resp 18  SpO2 97%  LMP 12/23/2014 Physical Exam  Constitutional: She is oriented to person, place, and time. She appears well-developed and well-nourished. No distress.  HENT:  Head: Normocephalic and atraumatic.  Eyes: EOM are normal.  Neck: Neck supple. No tracheal deviation present.  Cardiovascular: Normal rate and intact distal pulses.   Pulmonary/Chest: Effort normal. No respiratory distress.  Musculoskeletal: Normal range of motion.  Right knee: She exhibits no swelling.  Right knee: exam limited by body habitus bst no significant swelling observed. No redness or warmth. Joint stable. Bilateral meniscal tenderness. No calf or thigh tenderness.   Neurological: She is alert and oriented to person, place, and time.  Skin: Skin is warm and dry.  Psychiatric: She has a normal mood and affect. Her behavior is normal.  Nursing note and vitals reviewed.   ED Course  Procedures (including critical care time) DIAGNOSTIC STUDIES: Oxygen Saturation is 97% on RA, nl by my interpretation.    COORDINATION OF CARE: 11:20 PM Discussed treatment plan with pt at bedside and  pt agreed to plan.   Labs Review Labs Reviewed - No data to display  Imaging Review No results found. Elpidio Anis, PA-C, have personally reviewed and evaluated these images and lab results as part of my medical decision-making.    EKG Interpretation None      MDM   Final diagnoses:  None    1. Right knee pain  Knee joint stable, doubt liagmentous injury. Suspect arthritic pain vs meniscal damage. Knee immobilizer provided, Mobic recommended.   I personally performed the services described in this documentation, which was scribed in my presence. The recorded information has been reviewed and is accurate.     Elpidio Anis, PA-C 01/14/15 2036  Loren Racer, MD 01/16/15 [phone]

## 2015-01-12 NOTE — Discharge Instructions (Signed)
Arthritis, Nonspecific °Arthritis is inflammation of a joint. This usually means pain, redness, warmth or swelling are present. One or more joints may be involved. There are a number of types of arthritis. Your caregiver may not be able to tell what type of arthritis you have right away. °CAUSES  °The most common cause of arthritis is the wear and tear on the joint (osteoarthritis). This causes damage to the cartilage, which can break down over time. The knees, hips, back and neck are most often affected by this type of arthritis. °Other types of arthritis and common causes of joint pain include: °· Sprains and other injuries near the joint. Sometimes minor sprains and injuries cause pain and swelling that develop hours later. °· Rheumatoid arthritis. This affects hands, feet and knees. It usually affects both sides of your body at the same time. It is often associated with chronic ailments, fever, weight loss and general weakness. °· Crystal arthritis. Gout and pseudo gout can cause occasional acute severe pain, redness and swelling in the foot, ankle, or knee. °· Infectious arthritis. Bacteria can get into a joint through a break in overlying skin. This can cause infection of the joint. Bacteria and viruses can also spread through the blood and affect your joints. °· Drug, infectious and allergy reactions. Sometimes joints can become mildly painful and slightly swollen with these types of illnesses. °SYMPTOMS  °· Pain is the main symptom. °· Your joint or joints can also be red, swollen and warm or hot to the touch. °· You may have a fever with certain types of arthritis, or even feel overall ill. °· The joint with arthritis will hurt with movement. Stiffness is present with some types of arthritis. °DIAGNOSIS  °Your caregiver will suspect arthritis based on your description of your symptoms and on your exam. Testing may be needed to find the type of arthritis: °· Blood and sometimes urine tests. °· X-ray tests  and sometimes CT or MRI scans. °· Removal of fluid from the joint (arthrocentesis) is done to check for bacteria, crystals or other causes. Your caregiver (or a specialist) will numb the area over the joint with a local anesthetic, and use a needle to remove joint fluid for examination. This procedure is only minimally uncomfortable. °· Even with these tests, your caregiver may not be able to tell what kind of arthritis you have. Consultation with a specialist (rheumatologist) may be helpful. °TREATMENT  °Your caregiver will discuss with you treatment specific to your type of arthritis. If the specific type cannot be determined, then the following general recommendations may apply. °Treatment of severe joint pain includes: °· Rest. °· Elevation. °· Anti-inflammatory medication (for example, ibuprofen) may be prescribed. Avoiding activities that cause increased pain. °· Only take over-the-counter or prescription medicines for pain and discomfort as recommended by your caregiver. °· Cold packs over an inflamed joint may be used for 10 to 15 minutes every hour. Hot packs sometimes feel better, but do not use overnight. Do not use hot packs if you are diabetic without your caregiver's permission. °· A cortisone shot into arthritic joints may help reduce pain and swelling. °· Any acute arthritis that gets worse over the next 1 to 2 days needs to be looked at to be sure there is no joint infection. °Long-term arthritis treatment involves modifying activities and lifestyle to reduce joint stress jarring. This can include weight loss. Also, exercise is needed to nourish the joint cartilage and remove waste. This helps keep the muscles   around the joint strong. °HOME CARE INSTRUCTIONS  °· Do not take aspirin to relieve pain if gout is suspected. This elevates uric acid levels. °· Only take over-the-counter or prescription medicines for pain, discomfort or fever as directed by your caregiver. °· Rest the joint as much as  possible. °· If your joint is swollen, keep it elevated. °· Use crutches if the painful joint is in your leg. °· Drinking plenty of fluids may help for certain types of arthritis. °· Follow your caregiver's dietary instructions. °· Try low-impact exercise such as: °· Swimming. °· Water aerobics. °· Biking. °· Walking. °· Morning stiffness is often relieved by a warm shower. °· Put your joints through regular range-of-motion. °SEEK MEDICAL CARE IF:  °· You do not feel better in 24 hours or are getting worse. °· You have side effects to medications, or are not getting better with treatment. °SEEK IMMEDIATE MEDICAL CARE IF:  °· You have a fever. °· You develop severe joint pain, swelling or redness. °· Many joints are involved and become painful and swollen. °· There is severe back pain and/or leg weakness. °· You have loss of bowel or bladder control. °Document Released: 06/07/2004 Document Revised: 07/23/2011 Document Reviewed: 06/23/2008 °[employer]® Patient Information ©2015 [employer]. This information is not intended to replace advice given to you by your health care provider. Make sure you discuss any questions you have with your health care provider. ° °Cryotherapy °Cryotherapy means treatment with cold. Ice or gel packs can be used to reduce both pain and swelling. Ice is the most helpful within the first 24 to 48 hours after an injury or flare-up from overusing a muscle or joint. Sprains, strains, spasms, burning pain, shooting pain, and aches can all be eased with ice. Ice can also be used when recovering from surgery. Ice is effective, has very few side effects, and is safe for most people to use. °PRECAUTIONS  °Ice is not a safe treatment option for people with: °· Raynaud phenomenon. This is a condition affecting small blood vessels in the extremities. Exposure to cold may cause your problems to return. °· Cold hypersensitivity. There are many forms of cold hypersensitivity, including: °¨ Cold urticaria.  Red, itchy hives appear on the skin when the tissues begin to warm after being iced. °¨ Cold erythema. This is a red, itchy rash caused by exposure to cold. °¨ Cold hemoglobinuria. Red blood cells break down when the tissues begin to warm after being iced. The hemoglobin that carry oxygen are passed into the urine because they cannot combine with blood proteins fast enough. °· Numbness or altered sensitivity in the area being iced. °If you have any of the following conditions, do not use ice until you have discussed cryotherapy with your caregiver: °· Heart conditions, such as arrhythmia, angina, or chronic heart disease. °· High blood pressure. °· Healing wounds or open skin in the area being iced. °· Current infections. °· Rheumatoid arthritis. °· Poor circulation. °· Diabetes. °Ice slows the blood flow in the region it is applied. This is beneficial when trying to stop inflamed tissues from spreading irritating chemicals to surrounding tissues. However, if you expose your skin to cold temperatures for too long or without the proper protection, you can damage your skin or nerves. Watch for signs of skin damage due to cold. °HOME CARE INSTRUCTIONS °Follow these tips to use ice and cold packs safely. °· Place a dry or damp towel between the ice and skin. A damp towel will   cool the skin more quickly, so you may need to shorten the time that the ice is used. °· For a more rapid response, add gentle compression to the ice. °· Ice for no more than 10 to 20 minutes at a time. The bonier the area you are icing, the less time it will take to get the benefits of ice. °· Check your skin after 5 minutes to make sure there are no signs of a poor response to cold or skin damage. °· Rest 20 minutes or more between uses. °· Once your skin is numb, you can end your treatment. You can test numbness by very lightly touching your skin. The touch should be so light that you do not see the skin dimple from the pressure of your  fingertip. When using ice, most people will feel these normal sensations in this order: cold, burning, aching, and numbness. °· Do not use ice on someone who cannot communicate their responses to pain, such as small children or people with dementia. °HOW TO MAKE AN ICE PACK °Ice packs are the most common way to use ice therapy. Other methods include ice massage, ice baths, and cryosprays. Muscle creams that cause a cold, tingly feeling do not offer the same benefits that ice offers and should not be used as a substitute unless recommended by your caregiver. °To make an ice pack, do one of the following: °· Place crushed ice or a bag of frozen vegetables in a sealable plastic bag. Squeeze out the excess air. Place this bag inside another plastic bag. Slide the bag into a pillowcase or place a damp towel between your skin and the bag. °· Mix 3 parts water with 1 part rubbing alcohol. Freeze the mixture in a sealable plastic bag. When you remove the mixture from the freezer, it will be slushy. Squeeze out the excess air. Place this bag inside another plastic bag. Slide the bag into a pillowcase or place a damp towel between your skin and the bag. °SEEK MEDICAL CARE IF: °· You develop white spots on your skin. This may give the skin a blotchy (mottled) appearance. °· Your skin turns blue or pale. °· Your skin becomes waxy or hard. °· Your swelling gets worse. °MAKE SURE YOU:  °· Understand these instructions. °· Will watch your condition. °· Will get help right away if you are not doing well or get worse. °Document Released: 12/25/2010 Document Revised: 09/14/2013 Document Reviewed: 12/25/2010 °[employer]® Patient Information ©2015 [employer]. This information is not intended to replace advice given to you by your health care provider. Make sure you discuss any questions you have with your health care provider. ° °

## 2015-03-09 ENCOUNTER — Ambulatory Visit: Payer: Self-pay

## 2015-03-09 ENCOUNTER — Other Ambulatory Visit: Payer: Self-pay | Admitting: Occupational Medicine

## 2015-03-09 DIAGNOSIS — R7612 Nonspecific reaction to cell mediated immunity measurement of gamma interferon antigen response without active tuberculosis: Secondary | ICD-10-CM

## 2016-06-18 ENCOUNTER — Emergency Department (HOSPITAL_COMMUNITY): Payer: Medicaid Other

## 2016-06-18 ENCOUNTER — Emergency Department (HOSPITAL_COMMUNITY)
Admission: EM | Admit: 2016-06-18 | Discharge: 2016-06-18 | Disposition: A | Discharge status: Home / Self Care | Payer: Medicaid Other | Attending: Emergency Medicine | Admitting: Emergency Medicine

## 2016-06-18 ENCOUNTER — Encounter (HOSPITAL_COMMUNITY): Payer: Self-pay

## 2016-06-18 DIAGNOSIS — I509 Heart failure, unspecified: Secondary | ICD-10-CM | POA: Diagnosis not present

## 2016-06-18 DIAGNOSIS — R05 Cough: Secondary | ICD-10-CM | POA: Diagnosis not present

## 2016-06-18 DIAGNOSIS — F17210 Nicotine dependence, cigarettes, uncomplicated: Secondary | ICD-10-CM | POA: Insufficient documentation

## 2016-06-18 DIAGNOSIS — R6889 Other general symptoms and signs: Secondary | ICD-10-CM

## 2016-06-18 MED ORDER — BENZONATATE 100 MG PO CAPS: 100.0000 mg | ORAL_CAPSULE | Freq: Three times a day (TID) | ORAL | 0 refills | Status: DC

## 2016-06-18 MED ORDER — PSEUDOEPHEDRINE HCL 60 MG PO TABS: 60.0000 mg | ORAL_TABLET | Freq: Four times a day (QID) | ORAL | 0 refills | Status: DC | PRN

## 2016-06-18 NOTE — ED Provider Notes (Signed)
[facility]-EMERGENCY DEPT Provider Note   CSN: [id number] Arrival date & time: 06/18/16  0844     History   Chief Complaint Chief Complaint  Patient presents with  . Emesis  . Fever    HPI Jennifer Andrews is a 36 y.o. female.  HPI Jennifer Andrews is a 36 y.o. female with history of CHF for migraines, presents to emergency department complaining of flulike symptoms. She reports 3 days of headache, nasal congestion, sore throat, cough with thick green and one episode of blood-tinged sputum. She reports fever at home up to 101. She has been taking ibuprofen and NyQuil which has not helped. She denies any chest pain or shortness of breath. She denies any pain in her ears. Denies any nausea, vomiting, diarrhea. No urinary symptoms. Patient is a [employer] student, reports multiple sick contacts. Denies neck pain or stiffness.   Past Medical History:  Diagnosis Date  . CHF (congestive heart failure) (HCC)   . Migraines     There are no active problems to display for this patient.   Past Surgical History:  Procedure Laterality Date  . APPENDECTOMY    . CESAREAN SECTION      OB History    No data available       Home Medications    Prior to Admission medications   Medication Sig Start Date End Date Taking? Authorizing Provider  chlorhexidine (PERIDEX) 0.12 % solution 15 mLs. Rinse for 30 seconds twice daily after breakfast and before bed. 12/16/14   Historical Provider, MD  diazepam (VALIUM) 5 MG tablet Take 1 tablet (5 mg total) by mouth every 8 (eight) hours as needed for muscle spasms. Patient not taking: Reported on 01/12/2015 05/02/14   Blane OharaJoshua Zavitz, MD  ibuprofen (ADVIL,MOTRIN) 800 MG tablet Take 1 tablet by mouth every 6 (six) hours as needed. pain 12/20/14   Historical Provider, MD  meloxicam (MOBIC) 7.5 MG tablet Take 1 tablet (7.5 mg total) by mouth daily. 01/12/15   Elpidio AnisShari Upstill, PA-C  naproxen (NAPROSYN) 375 MG tablet Take 1 tablet (375 mg total) by mouth 2 (two) times daily  as needed. Patient not taking: Reported on 01/12/2015 05/02/14   Blane OharaJoshua Zavitz, MD  traMADol (ULTRAM) 50 MG tablet Take 50 mg by mouth every 6 (six) hours as needed for moderate pain.    Historical Provider, MD    Family History Family History  Problem Relation Age of Onset  . Asthma Mother   . Stroke Father   . Diabetes Father   . Heart failure Father     Social History Social History  Substance Use Topics  . Smoking status: Current Some Day Smoker    Types: Cigarettes  . Smokeless tobacco: Never Used  . Alcohol use No     Allergies   Patient has no known allergies.   Review of Systems Review of Systems  Constitutional: Positive for chills and fever.  HENT: Positive for congestion and sore throat. Negative for ear pain.   Respiratory: Positive for cough. Negative for chest tightness and shortness of breath.   Cardiovascular: Negative for chest pain, palpitations and leg swelling.  Gastrointestinal: Negative for abdominal pain, diarrhea, nausea and vomiting.  Genitourinary: Negative for dysuria, flank pain, pelvic pain, vaginal bleeding, vaginal discharge and vaginal pain.  Musculoskeletal: Negative for arthralgias, myalgias, neck pain and neck stiffness.  Skin: Negative for rash.  Neurological: Positive for headaches. Negative for dizziness and weakness.  All other systems reviewed and are negative.    Physical  Exam Updated Vital Signs BP 126/72 (BP Location: Right Arm)   Pulse 94   Temp 98.2 F (36.8 C) (Oral)   Resp 18   LMP 06/11/2016   SpO2 99%   Physical Exam  Constitutional: She is oriented to person, place, and time. She appears well-developed and well-nourished. No distress.  HENT:  Head: Normocephalic and atraumatic.  Right Ear: Tympanic membrane, external ear and ear canal normal.  Left Ear: Tympanic membrane, external ear and ear canal normal.  Nose: Mucosal edema and rhinorrhea present.  Mouth/Throat: Uvula is midline and mucous membranes are  normal. Posterior oropharyngeal erythema present. No oropharyngeal exudate, posterior oropharyngeal edema or tonsillar abscesses.  Eyes: Conjunctivae are normal.  Neck: Neck supple.  Cardiovascular: Normal rate, regular rhythm, normal heart sounds and intact distal pulses.   Pulmonary/Chest: Effort normal and breath sounds normal. No respiratory distress. She has no wheezes. She has no rales.  Abdominal: She exhibits no distension.  Musculoskeletal: Normal range of motion.  Neurological: She is alert and oriented to person, place, and time.  Skin: Skin is warm and dry.  Psychiatric: She has a normal mood and affect.  Nursing note and vitals reviewed.    ED Treatments / Results  Labs (all labs ordered are listed, but only abnormal results are displayed) Labs Reviewed - No data to display  EKG  EKG Interpretation None       Radiology Dg Chest 2 View  Result Date: 06/18/2016 CLINICAL DATA:  Flu-like symptoms.  Fever. EXAM: CHEST  2 VIEW COMPARISON:  03/09/2015 FINDINGS: Mild peribronchial thickening. Heart and mediastinal contours are within normal limits. No focal opacities or effusions. No acute bony abnormality. IMPRESSION: Mild bronchitic changes. Electronically Signed   By: Charlett Nose M.D.   On: 06/18/2016 10:55    Procedures Procedures (including critical care time)  Medications Ordered in ED Medications - No data to display   Initial Impression / Assessment and Plan / ED Course  I have reviewed the triage vital signs and the nursing notes.  Pertinent labs & imaging results that were available during my care of the patient were reviewed by me and considered in my medical decision making (see chart for details).    Pt in emergency dept with URI symptoms and productive cough. VS normal.  CXR negative. She is afebrile here. Non toxic appearing. Lungs clear. Most likely viral URI possibly influenza. Home with symptomatic tx and pcp follow up. Return precautions discussed.    Vitals:   06/18/16 0909 06/18/16 1056  BP: 126/72 121/85  Pulse: 94 96  Resp: 18 16  Temp: 98.2 F (36.8 C)   TempSrc: Oral   SpO2: 99% 97%     Final Clinical Impressions(s) / ED Diagnoses   Final diagnoses:  Flu-like symptoms    New Prescriptions Discharge Medication List as of 06/18/2016 11:04 AM    START taking these medications   Details  benzonatate (TESSALON) 100 MG capsule Take 1 capsule (100 mg total) by mouth every 8 (eight) hours., Starting Mon 06/18/2016, Print    pseudoephedrine (SUDAFED) 60 MG tablet Take 1 tablet (60 mg total) by mouth every 6 (six) hours as needed for congestion., Starting Mon 06/18/2016, Print         Jaynie Crumble, PA-C 06/18/16 1530    Raeford Razor, MD 06/20/16 [phone]

## 2016-06-18 NOTE — ED Triage Notes (Signed)
Pt with flu like symptoms since Friday.  Fever 100.1.  Congestion, n/v

## 2016-06-18 NOTE — Discharge Instructions (Signed)
Your chest xray and your vital signs are normal today. Continue to rest, ibuprofen and tylenol for fever and body aches. You can use afrin or over the counter saline nasal spray for congestion. Sudafed to help with congestion as well. Tessalon for cough. Drink plenty of fluids. Follow up with your doctor for recheck in 3 days if not improving.

## 2016-06-18 NOTE — ED Notes (Signed)
Bed: WTR7 Expected date:  Expected time:  Means of arrival:  Comments: 

## 2016-08-16 ENCOUNTER — Ambulatory Visit: Payer: Self-pay | Admitting: Obstetrics

## 2017-01-21 ENCOUNTER — Encounter (HOSPITAL_COMMUNITY): Payer: Self-pay

## 2017-01-21 ENCOUNTER — Emergency Department (HOSPITAL_COMMUNITY)
Admission: EM | Admit: 2017-01-21 | Discharge: 2017-01-21 | Disposition: A | Discharge status: Home / Self Care | Payer: Medicaid Other | Attending: Emergency Medicine | Admitting: Emergency Medicine

## 2017-01-21 ENCOUNTER — Emergency Department (HOSPITAL_COMMUNITY): Payer: Medicaid Other

## 2017-01-21 DIAGNOSIS — F17210 Nicotine dependence, cigarettes, uncomplicated: Secondary | ICD-10-CM | POA: Insufficient documentation

## 2017-01-21 DIAGNOSIS — R51 Headache: Secondary | ICD-10-CM | POA: Insufficient documentation

## 2017-01-21 DIAGNOSIS — I509 Heart failure, unspecified: Secondary | ICD-10-CM | POA: Insufficient documentation

## 2017-01-21 DIAGNOSIS — R21 Rash and other nonspecific skin eruption: Secondary | ICD-10-CM | POA: Insufficient documentation

## 2017-01-21 DIAGNOSIS — R9431 Abnormal electrocardiogram [ECG] [EKG]: Secondary | ICD-10-CM | POA: Diagnosis not present

## 2017-01-21 DIAGNOSIS — R519 Headache, unspecified: Secondary | ICD-10-CM

## 2017-01-21 LAB — COMPREHENSIVE METABOLIC PANEL
ALBUMIN: 3.7 g/dL (ref 3.5–5.0)
ALT: 21 U/L (ref 14–54)
AST: 29 U/L (ref 15–41)
Alkaline Phosphatase: 64 U/L (ref 38–126)
Anion gap: 6 (ref 5–15)
BUN: 10 mg/dL (ref 6–20)
CHLORIDE: 107 mmol/L (ref 101–111)
CO2: 27 mmol/L (ref 22–32)
CREATININE: 0.81 mg/dL (ref 0.44–1.00)
Calcium: 9.3 mg/dL (ref 8.9–10.3)
GFR calc Af Amer: >60 mL/min (ref >60)
GFR calc non Af Amer: >60 mL/min (ref >60)
GLUCOSE: 84 mg/dL (ref 65–99)
Potassium: 4 mmol/L (ref 3.5–5.1)
SODIUM: 140 mmol/L (ref 135–145)
Total Bilirubin: 0.6 mg/dL (ref 0.3–1.2)
Total Protein: 7.4 g/dL (ref 6.5–8.1)

## 2017-01-21 LAB — CBC WITH DIFFERENTIAL/PLATELET
Basophils Absolute: 0 10*3/uL (ref 0.0–0.1)
Basophils Relative: 0 %
EOS ABS: 0.2 10*3/uL (ref 0.0–0.7)
EOS PCT: 2 %
HCT: 39.8 % (ref 36.0–46.0)
Hemoglobin: 14 g/dL (ref 12.0–15.0)
LYMPHS PCT: 35 %
Lymphs Abs: 2.9 10*3/uL (ref 0.7–4.0)
MCH: 32.1 pg (ref 26.0–34.0)
MCHC: 35.2 g/dL (ref 30.0–36.0)
MCV: 91.3 fL (ref 78.0–100.0)
Monocytes Absolute: 0.5 10*3/uL (ref 0.1–1.0)
Monocytes Relative: 6 %
Neutro Abs: 4.9 10*3/uL (ref 1.7–7.7)
Neutrophils Relative %: 57 %
PLATELETS: 241 10*3/uL (ref 150–400)
RBC: 4.36 MIL/uL (ref 3.87–5.11)
RDW: 13 % (ref 11.5–15.5)
WBC: 8.5 10*3/uL (ref 4.0–10.5)

## 2017-01-21 LAB — I-STAT BETA HCG BLOOD, ED (MC, WL, AP ONLY): I-stat hCG, quantitative: <5 m[IU]/mL (ref <5)

## 2017-01-21 MED ORDER — METHYLPREDNISOLONE SODIUM SUCC 125 MG IJ SOLR
125.0000 mg | Freq: Once | INTRAMUSCULAR | Status: AC
  Administered 2017-01-21: 125 mg via INTRAVENOUS
  Filled 2017-01-21: qty 2

## 2017-01-21 MED ORDER — SODIUM CHLORIDE 0.9 % IV BOLUS (SEPSIS)
1000.0000 mL | Freq: Once | INTRAVENOUS | Status: AC
  Administered 2017-01-21: 1000 mL via INTRAVENOUS

## 2017-01-21 MED ORDER — METOCLOPRAMIDE HCL 5 MG/ML IJ SOLN
10.0000 mg | Freq: Once | INTRAMUSCULAR | Status: AC
  Administered 2017-01-21: 10 mg via INTRAVENOUS
  Filled 2017-01-21: qty 2

## 2017-01-21 MED ORDER — KETOROLAC TROMETHAMINE 30 MG/ML IJ SOLN
30.0000 mg | Freq: Once | INTRAMUSCULAR | Status: AC
  Administered 2017-01-21: 30 mg via INTRAVENOUS
  Filled 2017-01-21: qty 1

## 2017-01-21 MED ORDER — BUTALBITAL-APAP-CAFFEINE 50-325-40 MG PO TABS: 1.0000 | ORAL_TABLET | Freq: Four times a day (QID) | ORAL | 0 refills | Status: DC | PRN

## 2017-01-21 NOTE — ED Provider Notes (Signed)
[facility]-EMERGENCY DEPT Provider Note   CSN: [id number] Arrival date & time: 01/21/17  1045     History   Chief Complaint Chief Complaint  Patient presents with  . Headache  . Rash    around anus    HPI Jennifer Andrews is a 36 y.o. female.  HPI   Jennifer Andrews is a 36 y.o. female, with a history of Migraines, presenting to the ED with recurrent headaches over the last 2-3 weeks. She has headaches daily to every other day. They range from moderate to severe and debilitating. They tend to start in the bilateral occipital region and radiate into the area behind the eyes as well as down the neck. They tend to last all day and into the night. She notes that they are not worsening or increasing in frequency, just persistent. She has tried Tylenol, ibuprofen, as well as herbal remedies. She states Tylenol was previously effective, but is no longer. Her last dose of any analgesic was yesterday. Her current headache is on the right frontal region and behind her right eye, feels like squeezing pressure, 7/10, currently nonradiating. She also endorses some intermittent photophobia, nausea, lightheadedness, and blurry vision. She sometimes has a sharp pain under her left breasts that is tender to palpation.  It does not itch or hurt. She states she used to get intermittent headaches beginning in childhood, however, has not had a headache in the last 2 years and her previous headaches were never this bad or persistent. She has spoken to her PCP once about her headaches, was prescribed what sounds like possibly sumatriptan, but patient states that this made her nauseous and she thinks may have made the headaches worse.  She has a "rash or growth" in the perianal region that has been present for the last year. This has been giving her extra stress lately. She also notes that she stress at work.  Patient denies confusion, facial droop, fever/chills, weakness, numbness, vision loss, trauma/falls, or any  other complaints.  Patient denies changes in diet, supplements, medications, or caffeine use. Denies IV or other illicit drug use. Denies alcohol use.  Past Medical History:  Diagnosis Date  . CHF (congestive heart failure) (HCC) 2008   Patient states that this was a postpartum diagnosis and she was cleared from this and her cardiomegaly in 2009  . Migraines     There are no active problems to display for this patient.   Past Surgical History:  Procedure Laterality Date  . APPENDECTOMY    . CESAREAN SECTION      OB History    No data available       Home Medications    Prior to Admission medications   Medication Sig Start Date End Date Taking? Authorizing Provider  acetaminophen (TYLENOL) 500 MG tablet Take 1,000 mg by mouth every 6 (six) hours as needed for moderate pain.   Yes [provider]  butalbital-acetaminophen-caffeine (FIORICET, ESGIC) 579 278 552150-325-40 MG tablet Take 1-2 tablets by mouth every 6 (six) hours as needed for headache. 01/21/17 01/21/18  Rheannon Cerney, Hillard DankerShawn C, PA-C    Family History Family History  Problem Relation Age of Onset  . Asthma Mother   . Stroke Father   . Diabetes Father   . Heart failure Father     Social History Social History  Substance Use Topics  . Smoking status: Current Some Day Smoker    Types: Cigarettes  . Smokeless tobacco: Never Used  . Alcohol use No  Allergies   Patient has no known allergies.   Review of Systems Review of Systems  Constitutional: Negative for chills, diaphoresis and fever.  HENT: Negative for sore throat, trouble swallowing and voice change.   Respiratory: Negative for shortness of breath.   Gastrointestinal: Positive for nausea. Negative for abdominal pain, diarrhea and vomiting.  Musculoskeletal: Negative for back pain and neck stiffness.  Neurological: Positive for light-headedness and headaches. Negative for syncope, weakness and numbness.  Psychiatric/Behavioral: Negative for confusion  and hallucinations.  All other systems reviewed and are negative.    Physical Exam Updated Vital Signs BP 112/80 (BP Location: Right Arm)   Pulse 84   Temp 98.1 F (36.7 C) (Oral)   Resp 17   Ht 5' (1.524 m)   Wt 90.7 kg (200 lb)   LMP 01/20/2017   SpO2 100%   BMI 39.06 kg/m   Physical Exam  Constitutional: She is oriented to person, place, and time. She appears well-developed and well-nourished. No distress.  HENT:  Head: Normocephalic and atraumatic.  Mouth/Throat: Oropharynx is clear and moist.  Eyes: Pupils are equal, round, and reactive to light. Conjunctivae and EOM are normal.  Neck: Normal range of motion and full passive range of motion without pain. Neck supple. No muscular tenderness present. No Brudzinski's sign and no Kernig's sign noted.  Cardiovascular: Normal rate, regular rhythm, normal heart sounds and intact distal pulses.   Pulmonary/Chest: Effort normal and breath sounds normal. No respiratory distress.  Abdominal: Soft. There is no tenderness. There is no guarding.  Musculoskeletal: She exhibits tenderness. She exhibits no edema.  Normal motor function intact in all extremities and spine. No midline spinal tenderness.  Area of mild tenderness at the left midclavicular line on the left breast. No noted swelling, masses, fluctuance, erythema, instability, or crepitus.  Lymphadenopathy:    She has no cervical adenopathy.  Neurological: She is alert and oriented to person, place, and time.  No sensory deficits.  No noted speech deficits. No aphasia. Patient handles oral secretions without difficulty. No noted swallowing defects.  Equal grip strength bilaterally. Strength 5/5 in the upper extremities. Strength 5/5 with flexion and extension of the hips, knees, and ankles bilaterally.  Patellar DTRs 2+ bilaterally. Negative Romberg. No gait disturbance.  Coordination intact including heel to shin and finger to nose.  Cranial nerves III-XII grossly intact.    No facial droop.   Skin: Skin is warm and dry. Capillary refill takes less than 2 seconds. She is not diaphoretic.  Psychiatric: She has a normal mood and affect. Her behavior is normal.  Nursing note and vitals reviewed.    ED Treatments / Results  Labs (all labs ordered are listed, but only abnormal results are displayed) Labs Reviewed  CBC WITH DIFFERENTIAL/PLATELET  COMPREHENSIVE METABOLIC PANEL  I-STAT BETA HCG BLOOD, ED ([facility], [facility], [facility] ONLY)    EKG  EKG Interpretation  Date/Time:  Monday January 21 2017 11:15:42 EDT Ventricular Rate:  73 PR Interval:    QRS Duration: 88 QT Interval:  394 QTC Calculation: 435 R Axis:   -2 Text Interpretation:  Age not entered, assumed to be  36 years old for purpose of ECG interpretation Sinus rhythm Inferior infarct, old Baseline wander in lead(s) V6 Confirmed by Rolland Porter ([id number]) on 01/21/2017 4:22:54 PM       Radiology Ct Head Wo Contrast  Result Date: 01/21/2017 CLINICAL DATA:  Occipital and posterior neck pain. EXAM: CT HEAD WITHOUT CONTRAST TECHNIQUE: Contiguous axial images were obtained from  the base of the skull through the vertex without intravenous contrast. COMPARISON:  None FINDINGS: Brain: No evidence of acute infarction, hemorrhage, hydrocephalus, extra-axial collection or mass lesion/mass effect. Vascular: No hyperdense vessel or unexpected calcification. Skull: Normal. Negative for fracture or focal lesion. Sinuses/Orbits: No acute finding. Other: None. IMPRESSION: 1. No acute intracranial abnormalities.  Normal brain. Electronically Signed   By: Signa Kell M.D.   On: 01/21/2017 14:34    Procedures Procedures (including critical care time)  Medications Ordered in ED Medications  sodium chloride 0.9 % bolus 1,000 mL (0 mLs Intravenous Stopped 01/21/17 1500)  ketorolac (TORADOL) 30 MG/ML injection 30 mg (30 mg Intravenous Given 01/21/17 1401)  metoCLOPramide (REGLAN) injection 10 mg (10 mg Intravenous Given 01/21/17  1401)  methylPREDNISolone sodium succinate (SOLU-MEDROL) 125 mg/2 mL injection 125 mg (125 mg Intravenous Given 01/21/17 1401)     Initial Impression / Assessment and Plan / ED Course  I have reviewed the triage vital signs and the nursing notes.  Pertinent labs & imaging results that were available during my care of the patient were reviewed by me and considered in my medical decision making (see chart for details).      Patient presents with complaint of intermittent headache over the past couple weeks. She does note increased stress. Headache pattern seems to be consistent with possible tension headaches. Patient has no noted neurologic deficits. She is no red flag symptoms. No acute abnormalities on labs or CT. PCP and neurologist follow-up.  Patient states she will have the skin lesions evaluated by a dermatologist or her PCP. Patient's pain improved to 3/10 prior to discharge. She was offered further management, but declined stating she was ready for discharge. The patient was given instructions for home care as well as return precautions. Patient voices understanding of these instructions, accepts the plan, and is comfortable with discharge.  Vitals:   01/21/17 1103 01/21/17 1312 01/21/17 1432 01/21/17 1451  BP: 112/80  131/79   Pulse: 84 77 77 73  Resp: 17  16   Temp: 98.1 F (36.7 C)     TempSrc: Oral     SpO2: 100% 100% 99% 100%  Weight: 90.7 kg (200 lb)     Height: 5' (1.524 m)         Final Clinical Impressions(s) / ED Diagnoses   Final diagnoses:  Bad headache  Rash and nonspecific skin eruption    New Prescriptions Discharge Medication List as of 01/21/2017  2:51 PM    START taking these medications   Details  butalbital-acetaminophen-caffeine (FIORICET, ESGIC) 50-325-40 MG tablet Take 1-2 tablets by mouth every 6 (six) hours as needed for headache., Starting Mon 01/21/2017, Until Tue 01/21/2018, Print         Brady Plant C, PA-C 01/21/17 2312    Rolland Porter, MD 01/28/17 1049

## 2017-01-21 NOTE — Discharge Instructions (Signed)
There were no acute abnormalities on the lab work or head CT. Please be sure to stay well hydrated, reduce stress, and get adequate sleep.  May continue to use Tylenol and/or ibuprofen using the strategy below: Antiinflammatory medications: Take 600 mg of ibuprofen every 6 hours or 440 mg (over the counter dose) to 500 mg (prescription dose) of naproxen every 12 hours for the next 3 days while you still have a headache. After this time, these medications may be used as needed for pain. Take these medications with food to avoid upset stomach. Choose only one of these medications, do not take them together. Tylenol: Should you continue to have additional pain while taking the ibuprofen or naproxen, you may add in tylenol as needed. Your daily total maximum amount of tylenol from all sources should be limited to /day for persons without liver problems, or /day for those with liver problems. Fioricet: You may also try the Fioricet, however, please note that Fioricet contains Tylenol and the daily Tylenol maximum still applies.  Please follow-up with your primary care provider on this matter. You may also need to follow up with neurology. Please follow-up with your primary care provider or dermatologist for the skin eruption.

## 2017-01-21 NOTE — ED Triage Notes (Signed)
Patient presents with headaches starting approx 3-4 weeks ago. Patient reports the headaches "just dont go away. I live my life, but I take medicine to control the pain. Some days it gets so bad, I cant stand light and sound. They mess with my vision too." Patient reports having been treated for migraines by her PCP previously, but patient states "he tried a medication, but it didn't work." Patient reports taking everything from extra strength tylenol to Migraine medication. Patient states her current pain level is 7/10 and "is working its way back up."   Patient also reports a rash, around her anus, that have been there for a year. Patient reports itching to the site. Patient denies melena, vaginal discharge, vaginal pain, or dysuria.

## 2017-07-01 DIAGNOSIS — Z13220 Encounter for screening for lipoid disorders: Secondary | ICD-10-CM | POA: Diagnosis not present

## 2017-07-01 DIAGNOSIS — J069 Acute upper respiratory infection, unspecified: Secondary | ICD-10-CM | POA: Diagnosis not present

## 2017-07-01 DIAGNOSIS — Z131 Encounter for screening for diabetes mellitus: Secondary | ICD-10-CM | POA: Diagnosis not present

## 2017-07-18 ENCOUNTER — Other Ambulatory Visit (HOSPITAL_COMMUNITY)
Admission: RE | Admit: 2017-07-18 | Discharge: 2017-07-18 | Disposition: A | Discharge status: Home / Self Care | Payer: Medicaid Other | Source: Ambulatory Visit | Attending: Obstetrics and Gynecology | Admitting: Obstetrics and Gynecology

## 2017-07-18 ENCOUNTER — Encounter: Payer: Self-pay | Admitting: Certified Nurse Midwife

## 2017-07-18 ENCOUNTER — Ambulatory Visit: Payer: Medicaid Other | Admitting: Certified Nurse Midwife

## 2017-07-18 VITALS — BP 141/91 | HR 79 | Wt 259.0 lb

## 2017-07-18 DIAGNOSIS — Z0000 Encounter for general adult medical examination without abnormal findings: Secondary | ICD-10-CM | POA: Diagnosis not present

## 2017-07-18 DIAGNOSIS — Z23 Encounter for immunization: Secondary | ICD-10-CM

## 2017-07-18 DIAGNOSIS — Z01419 Encounter for gynecological examination (general) (routine) without abnormal findings: Secondary | ICD-10-CM | POA: Insufficient documentation

## 2017-07-18 DIAGNOSIS — A630 Anogenital (venereal) warts: Secondary | ICD-10-CM | POA: Diagnosis not present

## 2017-07-18 DIAGNOSIS — N898 Other specified noninflammatory disorders of vagina: Secondary | ICD-10-CM

## 2017-07-18 DIAGNOSIS — Z113 Encounter for screening for infections with a predominantly sexual mode of transmission: Secondary | ICD-10-CM

## 2017-07-18 NOTE — Progress Notes (Signed)
Subjective:        Jennifer Andrews is a 37 y.o. female here for a routine exam.  Current complaints: lesions around anal sphincter sometimes itching has noticed over the last few months. .  Has PCP to manage her blood pressure.  Recently lost her job the end of January.  Has a long term female partner who lives in [place].  Did use condoms.  History of BTL.    Desires full STD screening exam.   [facility]    Personal health questionnaire:  Is patient Ashkenazi Jewish, have a family history of breast and/or ovarian cancer: no Is there a family history of uterine cancer diagnosed at age < 850, gastrointestinal cancer, urinary tract cancer, family member who is a Personnel officerLynch syndrome-associated carrier: no Is the patient overweight and hypertensive, family history of diabetes, personal history of gestational diabetes, preeclampsia or PCOS: yes Is patient over 1855, have PCOS,  family history of premature CHD under age 37, diabetes, smoke, have hypertension or peripheral artery disease:  yes At any time, has a partner hit, kicked or otherwise hurt or frightened you?: no Over the past 2 weeks, have you felt down, depressed or hopeless?: no Over the past 2 weeks, have you felt little interest or pleasure in doing things?:no   Gynecologic History Patient's last menstrual period was 05/14/2017. Contraception: tubal ligation 5 years ago Last Pap: unknown. Results were: normal according to the patient Last mammogram: n/a <40 year without significant family history.   Obstetric History OB History  Gravida Para Term Preterm AB Living  4 4 4     4   SAB TAB Ectopic Multiple Live Births          4    # Outcome Date GA Lbr Len/2nd Weight Sex Delivery Anes PTL Lv  4 Term 06/23/12    F CS-LTranv   LIV  3 Term 01/06/07    F CS-LTranv   LIV  2 Term 08/22/05    F Vag-Spont   LIV  1 Term 03/15/99    Genella MechM Vag-Spont   LIV      Past Medical History:  Diagnosis Date  . CHF (congestive  heart failure) (HCC) 2008   Patient states that this was a postpartum diagnosis and she was cleared from this and her cardiomegaly in 2009  . Migraines     Past Surgical History:  Procedure Laterality Date  . APPENDECTOMY    . CESAREAN SECTION       Current Outpatient Medications:  .  butalbital-acetaminophen-caffeine (FIORICET, ESGIC) 50-325-40 MG tablet, Take 1-2 tablets by mouth every 6 (six) hours as needed for headache. (Patient not taking: Reported on 07/18/2017), Disp: 20 tablet, Rfl: 0 No Known Allergies  Social History   Tobacco Use  . Smoking status: Former Smoker    Types: Cigarettes  . Smokeless tobacco: Never Used  Substance Use Topics  . Alcohol use: No    Family History  Problem Relation Age of Onset  . Asthma Mother   . Stroke Father   . Diabetes Father   . Heart failure Father       Review of Systems  Constitutional: negative for fatigue and weight loss Respiratory: negative for cough and wheezing Cardiovascular: negative for chest pain, fatigue and palpitations Gastrointestinal: negative for abdominal pain and change in bowel habits Musculoskeletal:negative for myalgias Neurological: negative for gait problems and tremors Behavioral/Psych: negative for abusive relationship, depression Endocrine: negative for temperature intolerance  Genitourinary:negative for abnormal menstrual periods, genital lesions, hot flashes, sexual problems and vaginal discharge Integument/breast: negative for breast lump, breast tenderness, nipple discharge and skin lesion(s)    Objective:       BP (!) 141/91   Pulse 79   Wt 259 lb (117.5 kg)   LMP 05/14/2017 Comment: irregulr spotting since last cycle  BMI 50.58 kg/m  General:   alert  Skin:   no rash or abnormalities  Lungs:   clear to auscultation bilaterally  Heart:   regular rate and rhythm, S1, S2 normal, no murmur, click, rub or gallop  Breasts:   normal without suspicious masses, skin or nipple changes or  axillary nodes  Abdomen:  normal findings: no organomegaly, soft, non-tender and no hernia obese  Pelvis:  External genitalia: normal general appearance Urinary system: urethral meatus normal and bladder without fullness, nontender Vaginal: normal without tenderness, induration or masses Cervix: normal appearance Adnexa: normal bimanual exam Uterus: anteverted and non-tender, normal size Anal area: multiple genital warts noted about 20; treated with TCA in office today   Lab Review Urine pregnancy test Labs reviewed yes Radiologic studies reviewed no  50% of 45 min visit spent on counseling and coordination of care.   Assessment & Plan    Healthy female exam.    1. Vaginal discharge    - Cervicovaginal ancillary only  2. Screen for STD (sexually transmitted disease)     - Cervicovaginal ancillary only - RPR - Hepatitis C antibody - Hepatitis B surface antigen - HIV antibody  3. Genital warts     Treated with TCA in office      Gardasil series started  4. Morbid obesity (HCC)     Exercise encouraged and DASH diet - Hemoglobin A1c - TSH - Amb Referral to Bariatric Surgery  5. Well woman exam     - Cytology - PAP   Education reviewed: calcium supplements, depression evaluation, low fat, low cholesterol diet, safe sex/STD prevention, self breast exams, skin cancer screening and weight bearing exercise. Contraception: tubal ligation. Follow up in: 2 weeks.    Orders Placed This Encounter  Procedures  . RPR  . Hepatitis C antibody  . Hepatitis B surface antigen  . HIV antibody  . Hemoglobin A1c  . TSH  . Amb Referral to Bariatric Surgery    Referral Priority:   Routine    Referral Type:   Consultation    Referred to Provider:   Wilder Glade, MD    Number of Visits Requested:   1   Need to obtain previous records Possible management options include: Veragen cream Follow up as needed.

## 2017-07-18 NOTE — Addendum Note (Signed)
Addended by: Marya LandryFOSTER, Brandan Glauber D on: 07/18/2017 04:17 PM   Modules accepted: Orders

## 2017-07-18 NOTE — Patient Instructions (Addendum)
Genital Warts Genital warts are small growths in the genital area or anal area. They are caused by a type of germ (HPV virus). This germ is spread from person to person during sex. It can be spread through vaginal, anal, and oral sex. Genital warts can lead to other problems if they are not treated. Follow these instructions at home: Medicines  Apply over-the-counter and prescription medicines only as told by your doctor.  Do not use medicines that are meant for treating hand warts.  Talk with your doctor about using anti-itch creams. General instructions  Do not touch or scratch the warts.  Do not have sex until your treatment is done.  Tell your current and past sexual partners about your condition. They may need treatment.  Keep all follow-up visits as told by your doctor. This is important.  After treatment, use condoms during sex. Other Instructions for Women  Women who have genital warts may need to be checked more often for cervical cancer.  If you become pregnant, tell your doctor that you have had genital warts. The germ can be passed to the baby. Contact a doctor if:  You have redness, swelling, or pain in the area of the treated skin.  You have a fever.  You feel generally sick.  You feel lumps in and around your genital area or anal area.  You have bleeding in your genital area or anal area.  You have pain during sex. This information is not intended to replace advice given to you by your health care provider. Make sure you discuss any questions you have with your health care provider. Document Released: 07/25/2009 Document Revised: 10/06/2015 Document Reviewed: 07/26/2014 Elsevier Interactive Patient Education  2018 [employer].  Human Papillomavirus Human papillomavirus (HPV) is the most common sexually transmitted infection (STI). It is easy to pass it from person to person (contagious). HPV can cause cervical cancer, anal cancer, and genital warts. The  genital warts can be seen and felt. Also, there may be wartlike regions in the throat. HPV may not have any symptoms. It is possible to have HPV for a long time and not know it. You may pass HPV on to others without knowing it. Follow these instructions at home:  Take medicines as told by your doctor.  Use over-the-counter creams for itching as told by your doctor.  Keep all follow-up visits. Make sure to get Pap tests as told by your doctor.  Do not touch or scratch the warts.  Do not treat genital warts with medicines used for treating hand warts.  Do not have sex while you are getting treatment.  Do not douche or use tampons during treatment of HPV.  Tell your sex partner about your infection because he or she may also need treatment.  If you get pregnant, tell your doctor that you had HPV. Your doctor will watch your pregnancy closely. This is important to keep your baby safe.  After treatment, use condoms during sex to prevent future infections.  Have only one sex partner.  Have a sex partner who does not have other sex partners. Contact a doctor if:  The treated skin is red, swollen, or painful.  You have a fever.  You feel ill.  You feel lumps or pimple-like areas in and around your genital area.  You have bleeding of the vagina or the area that was treated.  You have pain during sex. This information is not intended to replace advice given to you by your  health care provider. Make sure you discuss any questions you have with your health care provider. Document Released: 04/12/2008 Document Revised: 10/06/2015 Document Reviewed: 08/05/2013 Elsevier Interactive Patient Education  2017 Elsevier Inc. HPV (Human Papillomavirus) Vaccine: What You Need to Know 1. Why get vaccinated? HPV vaccine prevents infection with human papillomavirus (HPV) types that are associated with many cancers, including:  cervical cancer in females,  vaginal and vulvar cancers in  females,  anal cancer in females and males,  throat cancer in females and males, and  penile cancer in males.  In addition, HPV vaccine prevents infection with HPV types that cause genital warts in both females and males. In the [place] about 12,000 women get cervical cancer every year, and about 4,000 women die from it. HPV vaccine can prevent most of these cases of cervical cancer. Vaccination is not a substitute for cervical cancer screening. This vaccine does not protect against all HPV types that can cause cervical cancer. Women should still get regular Pap tests. HPV infection usually comes from sexual contact, and most people will become infected at some point in their life. About 14 million Americans, including teens, get infected every year. Most infections will go away on their own and not cause serious problems. But thousands of women and men get cancer and other diseases from HPV. 2. HPV vaccine HPV vaccine is approved by FDA and is recommended by CDC for both males and females. It is routinely given at 8711 or 10112 years of age, but it may be given beginning at age 709 years through age 37 years. Most adolescents 9 through 37 years of age should get HPV vaccine as a two-dose series with the doses separated by 6-12 months. People who start HPV vaccination at 37 years of age and older should get the vaccine as a three-dose series with the second dose given 1-2 months after the first dose and the third dose given 6 months after the first dose. There are several exceptions to these age recommendations. Your health care provider can give you more information. 3. Some people should not get this vaccine  Anyone who has had a severe (life-threatening) allergic reaction to a dose of HPV vaccine should not get another dose.  Anyone who has a severe (life threatening) allergy to any component of HPV vaccine should not get the vaccine.  Tell your doctor if you have any severe allergies that you know  of, including a severe allergy to yeast.  HPV vaccine is not recommended for pregnant women. If you learn that you were pregnant when you were vaccinated, there is no reason to expect any problems for you or your baby. Any woman who learns she was pregnant when she got HPV vaccine is encouraged to contact the manufacturer's registry for HPV vaccination during pregnancy at 76580685491-(337)431-0500. Women who are breastfeeding may be vaccinated.  If you have a mild illness, such as a cold, you can probably get the vaccine today. If you are moderately or severely ill, you should probably wait until you recover. Your doctor can advise you. 4. Risks of a vaccine reaction With any medicine, including vaccines, there is a chance of side effects. These are usually mild and go away on their own, but serious reactions are also possible. Most people who get HPV vaccine do not have any serious problems with it. Mild or moderate problems following HPV vaccine:  Reactions in the arm where the shot was given: ? Soreness (about 9 people in 10) ?  Redness or swelling (about 1 person in 3)  Fever: ? Mild (100F) (about 1 person in 10) ? Moderate (102F) (about 1 person in 7)  Other problems: ? Headache (about 1 person in 3) Problems that could happen after any injected vaccine:  People sometimes faint after a medical procedure, including vaccination. Sitting or lying down for about 15 minutes can help prevent fainting, and injuries caused by a fall. Tell your doctor if you feel dizzy, or have vision changes or ringing in the ears.  Some people get severe pain in the shoulder and have difficulty moving the arm where a shot was given. This happens very rarely.  Any medication can cause a severe allergic reaction. Such reactions from a vaccine are very rare, estimated at about 1 in a million doses, and would happen within a few minutes to a few hours after the vaccination. As with any medicine, there is a very remote  chance of a vaccine causing a serious injury or death. The safety of vaccines is always being monitored. For more information, visit: http://floyd.org/. 5. What if there is a serious reaction? What should I look for? Look for anything that concerns you, such as signs of a severe allergic reaction, very high fever, or unusual behavior. Signs of a severe allergic reaction can include hives, swelling of the face and throat, difficulty breathing, a fast heartbeat, dizziness, and weakness. These would usually start a few minutes to a few hours after the vaccination. What should I do? If you think it is a severe allergic reaction or other emergency that can't wait, call 9-1-1 or get to the nearest hospital. Otherwise, call your doctor. Afterward, the reaction should be reported to the Vaccine Adverse Event Reporting System (VAERS). Your doctor should file this report, or you can do it yourself through the VAERS web site at www.vaers.LAgents.no, or by calling 1-(870) 320-6795. VAERS does not give medical advice. 6. The National Vaccine Injury Compensation Program The [employer] Vaccine Injury Compensation Program (VICP) is a federal program that was created to compensate people who may have been injured by certain vaccines. Persons who believe they may have been injured by a vaccine can learn about the program and about filing a claim by calling 1-938 204 9461 or visiting the VICP website at SpiritualWord.at. There is a time limit to file a claim for compensation. 7. How can I learn more?  Ask your health care provider. He or she can give you the vaccine package insert or suggest other sources of information.  Call your local or state health department.  Contact the Centers for Disease Control and Prevention (CDC): ? Call 629-107-0978 (1-800-CDC-INFO) or ? Visit CDC's website at RunningConvention.de Vaccine Information Statement, HPV Vaccine (04/15/2015) This information is not  intended to replace advice given to you by your health care provider. Make sure you discuss any questions you have with your health care provider. Document Released: 11/25/2013 Document Revised: 01/19/2016 Document Reviewed: 01/19/2016 Elsevier Interactive Patient Education  2017 [employer].

## 2017-07-19 LAB — HEPATITIS C ANTIBODY: Hep C Virus Ab: <0.1 s/co ratio (ref 0.0–0.9)

## 2017-07-19 LAB — HEMOGLOBIN A1C
Est. average glucose Bld gHb Est-mCnc: 108 mg/dL
Hgb A1c MFr Bld: 5.4 % (ref 4.8–5.6)

## 2017-07-19 LAB — TSH: TSH: 1.98 u[IU]/mL (ref 0.450–4.500)

## 2017-07-19 LAB — HEPATITIS B SURFACE ANTIGEN: Hepatitis B Surface Ag: NEGATIVE

## 2017-07-19 LAB — HIV ANTIBODY (ROUTINE TESTING W REFLEX): HIV Screen 4th Generation wRfx: NONREACTIVE

## 2017-07-19 LAB — RPR: RPR: NONREACTIVE

## 2017-07-20 LAB — CERVICOVAGINAL ANCILLARY ONLY
Bacterial vaginitis: NEGATIVE
CANDIDA VAGINITIS: NEGATIVE
Chlamydia: NEGATIVE
Neisseria Gonorrhea: NEGATIVE
Trichomonas: NEGATIVE

## 2017-07-22 LAB — CYTOLOGY - PAP
Diagnosis: NEGATIVE
HPV (WINDOPATH): NOT DETECTED

## 2017-07-23 ENCOUNTER — Other Ambulatory Visit: Payer: Self-pay | Admitting: Certified Nurse Midwife

## 2017-08-01 ENCOUNTER — Other Ambulatory Visit: Payer: Self-pay | Admitting: Certified Nurse Midwife

## 2017-08-01 ENCOUNTER — Telehealth: Payer: Self-pay

## 2017-08-01 ENCOUNTER — Encounter: Payer: Self-pay | Admitting: Certified Nurse Midwife

## 2017-08-01 ENCOUNTER — Ambulatory Visit: Payer: Medicaid Other | Admitting: Certified Nurse Midwife

## 2017-08-01 VITALS — BP 132/78 | HR 83 | Wt 261.6 lb

## 2017-08-01 DIAGNOSIS — A630 Anogenital (venereal) warts: Secondary | ICD-10-CM

## 2017-08-01 DIAGNOSIS — N949 Unspecified condition associated with female genital organs and menstrual cycle: Secondary | ICD-10-CM

## 2017-08-01 MED ORDER — MUPIROCIN 2 % EX OINT: 1.0000 "application " | TOPICAL_OINTMENT | Freq: Two times a day (BID) | CUTANEOUS | 99 refills | Status: DC | PRN

## 2017-08-01 NOTE — Telephone Encounter (Signed)
Bactroban was sent a few minutes ago to her pharmacy.  Thank you. Clemence Lengyel

## 2017-08-01 NOTE — Telephone Encounter (Signed)
TC from pt seen today Stating Rx is not at pharm ([facility] [place])   Please advise.

## 2017-08-01 NOTE — Progress Notes (Signed)
Patient ID: Jennifer Andrews, female   DOB: [birth year], 37 y.o.   MRN: [id number]  Chief Complaint  Patient presents with  . Follow-up    HPI Jennifer Andrews is a 37 y.o. female.  Here for f/u on genital warts.  S/P TCA treatment in office on 07/18/17, has noticed dramatic improvement.  Has her daughter help her at home.    HPI  Past Medical History:  Diagnosis Date  . CHF (congestive heart failure) (HCC) 2008   Patient states that this was a postpartum diagnosis and she was cleared from this and her cardiomegaly in 2009  . Migraines     Past Surgical History:  Procedure Laterality Date  . APPENDECTOMY    . CESAREAN SECTION      Family History  Problem Relation Age of Onset  . Asthma Mother   . Stroke Father   . Diabetes Father   . Heart failure Father     Social History Social History   Tobacco Use  . Smoking status: Former Smoker    Types: Cigarettes  . Smokeless tobacco: Never Used  Substance Use Topics  . Alcohol use: No  . Drug use: No    No Known Allergies  No current outpatient medications on file.   No current facility-administered medications for this visit.     Review of Systems Review of Systems Constitutional: negative for fatigue and weight loss Respiratory: negative for cough and wheezing Cardiovascular: negative for chest pain, fatigue and palpitations Gastrointestinal: negative for abdominal pain and change in bowel habits Genitourinary:negative Integument/breast: negative for nipple discharge Musculoskeletal:negative for myalgias Neurological: negative for gait problems and tremors Behavioral/Psych: negative for abusive relationship, depression Endocrine: negative for temperature intolerance      Blood pressure 132/78, pulse 83, weight 261 lb 9.6 oz (118.7 kg), last menstrual period 07/19/2017.  Physical Exam Physical Exam General:   alert  Skin:   no rash or abnormalities  Lungs:   clear to auscultation bilaterally  Heart:   regular  rate and rhythm, S1, S2 normal, no murmur, click, rub or gallop  Breasts:   deferred  Abdomen:  normal findings: no organomegaly, soft, non-tender and no hernia  Pelvis:  External genitalia: normal general appearance, 1 skin tag treated with TCA on upper left labia.  Warts essentially gone.  1 area where the skin is excoriated with puss like discharge in gluteal fold.  Cleaned and antibiotic ointment placed on wound.  Wound care instructions given to patient.  No other TCA used today.   Urinary system: urethral meatus normal and bladder without fullness, nontender     20% of 15 min visit spent on counseling and coordination of care.   Data Reviewed Previous medical records, meds  Assessment      S/P TCA treatment of genital warts  Skin tag    Plan    Continue Gardasil series Follow up as needed.

## 2017-09-17 ENCOUNTER — Encounter: Payer: Self-pay | Admitting: *Deleted

## 2017-09-17 ENCOUNTER — Ambulatory Visit: Payer: Self-pay

## 2017-09-17 ENCOUNTER — Ambulatory Visit (INDEPENDENT_AMBULATORY_CARE_PROVIDER_SITE_OTHER): Payer: Medicaid Other

## 2017-09-17 DIAGNOSIS — Z23 Encounter for immunization: Secondary | ICD-10-CM | POA: Diagnosis not present

## 2017-09-17 NOTE — Progress Notes (Signed)
I have reviewed the chart and agree with nursing staff's documentation of this patient's encounter.  Catalina Antigua, MD 09/17/2017 8:37 AM

## 2017-09-17 NOTE — Progress Notes (Signed)
Nurse visit for office supply 2nd Gardasil inj given R Del w/o difficulty. Next inj due 6 mos from the 1st which will be in Sept, pt agrees.

## 2017-12-03 ENCOUNTER — Encounter (HOSPITAL_COMMUNITY): Payer: Self-pay | Admitting: Emergency Medicine

## 2017-12-03 ENCOUNTER — Emergency Department (HOSPITAL_COMMUNITY)
Admission: EM | Admit: 2017-12-03 | Discharge: 2017-12-03 | Disposition: A | Discharge status: Home / Self Care | Payer: Medicaid Other | Attending: Emergency Medicine | Admitting: Emergency Medicine

## 2017-12-03 DIAGNOSIS — G8929 Other chronic pain: Secondary | ICD-10-CM | POA: Insufficient documentation

## 2017-12-03 DIAGNOSIS — Z87891 Personal history of nicotine dependence: Secondary | ICD-10-CM | POA: Insufficient documentation

## 2017-12-03 DIAGNOSIS — I509 Heart failure, unspecified: Secondary | ICD-10-CM | POA: Diagnosis not present

## 2017-12-03 DIAGNOSIS — M545 Low back pain: Secondary | ICD-10-CM | POA: Insufficient documentation

## 2017-12-03 DIAGNOSIS — M7989 Other specified soft tissue disorders: Secondary | ICD-10-CM | POA: Diagnosis not present

## 2017-12-03 NOTE — Discharge Instructions (Signed)
If you develop worsening back pain, fevers, vomiting, weakness or numbness in your legs, incontinence of your bowels or bladder, or any other new/concerning symptoms or return to the ER for evaluation.  Otherwise use ibuprofen and Tylenol for your pain.  Follow-up with your primary care physician.

## 2017-12-03 NOTE — ED Triage Notes (Signed)
Patient here from home with complaints of lower back pain radiating down into leg. Hx of sciatica.

## 2017-12-03 NOTE — ED Provider Notes (Signed)
[facility]-EMERGENCY DEPT Provider Note   CSN: [id number] Arrival date & time: 12/03/17  0700     History   Chief Complaint Chief Complaint  Patient presents with  . Back Pain    HPI Jennifer Andrews is a 37 y.o. female.  HPI  37 year old female presents with chronic back pain that is worsening.  She is had this back pain since her last baby was born in 2014.  She thinks is from the epidural.  The pain is across her lower thoracic and entire lumbar back.  It is bilateral and midline.  It does not radiate however.  She states over the last week or so it seems to be a little worse and she is having a hard time getting up and walking around at work.  There is no weakness or numbness in her legs.  No incontinence or fevers.  No urinary symptoms.  She is been trying Tylenol and Excedrin.  She has noticed some on and off leg swelling, most recently 2 days ago but that seems to be much better now there is only trace swelling in her feet.  Past Medical History:  Diagnosis Date  . CHF (congestive heart failure) (HCC) 2008   Patient states that this was a postpartum diagnosis and she was cleared from this and her cardiomegaly in 2009  . Migraines     There are no active problems to display for this patient.   Past Surgical History:  Procedure Laterality Date  . APPENDECTOMY    . CESAREAN SECTION       OB History    Gravida  4   Para  4   Term  4   Preterm      AB      Living  4     SAB      TAB      Ectopic      Multiple      Live Births  4            Home Medications    Prior to Admission medications   Medication Sig Start Date End Date Taking? Authorizing Provider  mupirocin ointment (BACTROBAN) 2 % Apply 1 application topically 2 (two) times daily as needed. 08/01/17   Roe Coombsenney, Rachelle A, CNM    Family History Family History  Problem Relation Age of Onset  . Asthma Mother   . Stroke Father   . Diabetes Father   . Heart  failure Father     Social History Social History   Tobacco Use  . Smoking status: Former Smoker    Types: Cigarettes  . Smokeless tobacco: Never Used  Substance Use Topics  . Alcohol use: No  . Drug use: No     Allergies   Patient has no known allergies.   Review of Systems Review of Systems  Constitutional: Negative for fever.  Cardiovascular: Positive for leg swelling.  Genitourinary: Negative for dysuria and hematuria.  Musculoskeletal: Positive for back pain.  Neurological: Negative for weakness and numbness.     Physical Exam Updated Vital Signs BP 135/82 (BP Location: Left Arm)   Pulse 82   Temp 98 F (36.7 C) (Oral)   SpO2 100%   Physical Exam  Constitutional: She is oriented to person, place, and time. She appears well-developed and well-nourished. No distress.  Morbidly obese  HENT:  Head: Normocephalic and atraumatic.  Right Ear: External ear normal.  Left Ear: External ear normal.  Nose: Nose normal.  Eyes: Right eye exhibits no discharge. Left eye exhibits no discharge.  Cardiovascular: Normal rate, regular rhythm and normal heart sounds.  Pulmonary/Chest: Effort normal and breath sounds normal.  Abdominal: Soft. There is no tenderness.  Musculoskeletal: She exhibits no edema.       Thoracic back: She exhibits tenderness.       Lumbar back: She exhibits tenderness.       Back:  Diffuse lower thoracic and diffuse lumbar tenderness  no appreciable pedal edema or pitting edema  Neurological: She is alert and oriented to person, place, and time.  5/5 strength in bilateral lower extremities. Normal gross sensation  Skin: Skin is warm and dry. She is not diaphoretic.  Nursing note and vitals reviewed.    ED Treatments / Results  Labs (all labs ordered are listed, but only abnormal results are displayed) Labs Reviewed - No data to display  EKG None  Radiology No results found.  Procedures Procedures (including critical care  time)  Medications Ordered in ED Medications - No data to display   Initial Impression / Assessment and Plan / ED Course  I have reviewed the triage vital signs and the nursing notes.  Pertinent labs & imaging results that were available during my care of the patient were reviewed by me and considered in my medical decision making (see chart for details).     Patient appears to be having an exacerbation of her chronic back pain.  There are no red flags such as fever or neurologic symptoms.  There is no indication this would be a spinal emergency.  Also have low suspicion for intra-abdominal or retroperitoneal pathology.  No urinary symptoms.  I discussed taking either Excedrin or Tylenol as these both contain Tylenol and also adding ibuprofen as she has no contraindications.  We discussed strict return precautions as well as back exercises and losing weight.  She understands all these and is instructed to follow-up with PCP. Offered toradol/ibuprofen in ED but she declines, will take at home.  Final Clinical Impressions(s) / ED Diagnoses   Final diagnoses:  Chronic bilateral low back pain without sciatica    ED Discharge Orders    None       Pricilla Loveless, MD 12/03/17 [phone]

## 2017-12-24 ENCOUNTER — Other Ambulatory Visit: Payer: Self-pay

## 2017-12-24 ENCOUNTER — Emergency Department (HOSPITAL_COMMUNITY)
Admission: EM | Admit: 2017-12-24 | Discharge: 2017-12-24 | Disposition: A | Discharge status: Home / Self Care | Payer: Medicaid Other | Attending: Emergency Medicine | Admitting: Emergency Medicine

## 2017-12-24 ENCOUNTER — Encounter (HOSPITAL_COMMUNITY): Payer: Self-pay

## 2017-12-24 ENCOUNTER — Emergency Department (HOSPITAL_COMMUNITY): Payer: Medicaid Other

## 2017-12-24 DIAGNOSIS — R112 Nausea with vomiting, unspecified: Secondary | ICD-10-CM | POA: Insufficient documentation

## 2017-12-24 DIAGNOSIS — Z87891 Personal history of nicotine dependence: Secondary | ICD-10-CM | POA: Diagnosis not present

## 2017-12-24 DIAGNOSIS — R197 Diarrhea, unspecified: Secondary | ICD-10-CM | POA: Diagnosis not present

## 2017-12-24 DIAGNOSIS — R1030 Lower abdominal pain, unspecified: Secondary | ICD-10-CM | POA: Diagnosis not present

## 2017-12-24 DIAGNOSIS — R109 Unspecified abdominal pain: Secondary | ICD-10-CM | POA: Diagnosis not present

## 2017-12-24 LAB — COMPREHENSIVE METABOLIC PANEL
ALBUMIN: 3.6 g/dL (ref 3.5–5.0)
ALT: 25 U/L (ref 0–44)
ANION GAP: 6 (ref 5–15)
AST: 33 U/L (ref 15–41)
Alkaline Phosphatase: 57 U/L (ref 38–126)
BUN: 9 mg/dL (ref 6–20)
CHLORIDE: 108 mmol/L (ref 98–111)
CO2: 28 mmol/L (ref 22–32)
Calcium: 8.8 mg/dL — ABNORMAL LOW (ref 8.9–10.3)
Creatinine, Ser: 0.8 mg/dL (ref 0.44–1.00)
GFR calc Af Amer: >60 mL/min (ref >60)
GFR calc non Af Amer: >60 mL/min (ref >60)
GLUCOSE: 91 mg/dL (ref 70–99)
POTASSIUM: 3.8 mmol/L (ref 3.5–5.1)
Sodium: 142 mmol/L (ref 135–145)
Total Bilirubin: 0.4 mg/dL (ref 0.3–1.2)
Total Protein: 7 g/dL (ref 6.5–8.1)

## 2017-12-24 LAB — URINALYSIS, ROUTINE W REFLEX MICROSCOPIC
BACTERIA UA: NONE SEEN
BILIRUBIN URINE: NEGATIVE
Glucose, UA: NEGATIVE mg/dL
KETONES UR: NEGATIVE mg/dL
LEUKOCYTES UA: NEGATIVE
Nitrite: NEGATIVE
Protein, ur: NEGATIVE mg/dL
SPECIFIC GRAVITY, URINE: 1.018 (ref 1.005–1.030)
pH: 6 (ref 5.0–8.0)

## 2017-12-24 LAB — CBC
HEMATOCRIT: 38.9 % (ref 36.0–46.0)
HEMOGLOBIN: 13.1 g/dL (ref 12.0–15.0)
MCH: 31.7 pg (ref 26.0–34.0)
MCHC: 33.7 g/dL (ref 30.0–36.0)
MCV: 94.2 fL (ref 78.0–100.0)
Platelets: 274 10*3/uL (ref 150–400)
RBC: 4.13 MIL/uL (ref 3.87–5.11)
RDW: 13.7 % (ref 11.5–15.5)
WBC: 8.2 10*3/uL (ref 4.0–10.5)

## 2017-12-24 LAB — LIPASE, BLOOD: LIPASE: 33 U/L (ref 11–51)

## 2017-12-24 LAB — I-STAT BETA HCG BLOOD, ED (MC, WL, AP ONLY): I-stat hCG, quantitative: <5 m[IU]/mL (ref <5)

## 2017-12-24 MED ORDER — DICYCLOMINE HCL 20 MG PO TABS: 20.0000 mg | ORAL_TABLET | Freq: Three times a day (TID) | ORAL | 0 refills | Status: DC | PRN

## 2017-12-24 MED ORDER — IOPAMIDOL (ISOVUE-300) INJECTION 61%
INTRAVENOUS | Status: AC
  Filled 2017-12-24: qty 100

## 2017-12-24 MED ORDER — ONDANSETRON HCL 4 MG/2ML IJ SOLN
4.0000 mg | Freq: Once | INTRAMUSCULAR | Status: AC
  Administered 2017-12-24: 4 mg via INTRAVENOUS
  Filled 2017-12-24: qty 2

## 2017-12-24 MED ORDER — SODIUM CHLORIDE 0.9 % IV BOLUS
1000.0000 mL | Freq: Once | INTRAVENOUS | Status: AC
  Administered 2017-12-24: 1000 mL via INTRAVENOUS

## 2017-12-24 MED ORDER — ONDANSETRON 4 MG PO TBDP: ORAL_TABLET | ORAL | 0 refills | Status: DC

## 2017-12-24 MED ORDER — DICYCLOMINE HCL 10 MG PO CAPS
10.0000 mg | ORAL_CAPSULE | Freq: Once | ORAL | Status: AC
Start: 2017-12-24 — End: 2017-12-24
  Administered 2017-12-24: 10 mg via ORAL
  Filled 2017-12-24: qty 1

## 2017-12-24 MED ORDER — KETOROLAC TROMETHAMINE 30 MG/ML IJ SOLN
30.0000 mg | Freq: Once | INTRAMUSCULAR | Status: AC
  Administered 2017-12-24: 30 mg via INTRAVENOUS
  Filled 2017-12-24: qty 1

## 2017-12-24 MED ORDER — IOPAMIDOL (ISOVUE-300) INJECTION 61%
100.0000 mL | Freq: Once | INTRAVENOUS | Status: AC | PRN
  Administered 2017-12-24: 100 mL via INTRAVENOUS

## 2017-12-24 NOTE — ED Provider Notes (Signed)
[facility]-EMERGENCY DEPT Provider Note   CSN: [id number] Arrival date & time: 12/24/17  1100     History   Chief Complaint Chief Complaint  Patient presents with  . Abdominal Pain  . Diarrhea  . Emesis    HPI Jennifer Andrews is a 37 y.o. 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  Patient states that she ate several bites of a uncooked burger 2 days ago at [employer].  States that the meat seems uncooked so she returned it and got her back and is still appears raw.  She only took several bites and then afterwards she had some vomiting.  She states that over the last 2 days, she had persistent nausea as well as diffuse abdominal pain and vomiting.  Patient also has several episodes of watery diarrhea as well.  She denies any fevers and her family is sick with similar symptoms.   The history is provided by the patient.    Past Medical History:  Diagnosis Date  . CHF (congestive heart failure) (HCC) 2008   Patient states that this was a postpartum diagnosis and she was cleared from this and her cardiomegaly in 2009  . Migraines     There are no active problems to display for this patient.   Past Surgical History:  Procedure Laterality Date  . APPENDECTOMY    . CESAREAN SECTION       OB History    Gravida  4   Para  4   Term  4   Preterm      AB      Living  4     SAB      TAB      Ectopic      Multiple      Live Births  4            Home Medications    Prior to Admission medications   Medication Sig Start Date End Date Taking? Authorizing Provider  mupirocin ointment (BACTROBAN) 2 % Apply 1 application topically 2 (two) times daily as needed. Patient not taking: Reported on 12/24/2017 08/01/17   Roe Coombsenney, Rachelle A, CNM    Family History Family History  Problem Relation Age of Onset  . Asthma Mother   . Stroke Father   . Diabetes Father   . Heart failure Father      Social History Social History   Tobacco Use  . Smoking status: Former Smoker    Types: Cigarettes  . Smokeless tobacco: Never Used  Substance Use Topics  . Alcohol use: No  . Drug use: No     Allergies   Patient has no known allergies.   Review of Systems Review of Systems  Gastrointestinal: Positive for abdominal pain, diarrhea and vomiting.  All other systems reviewed and are negative.    Physical Exam Updated Vital Signs BP 101/77 (BP Location: Left Arm)   Pulse 93   Resp 16   Ht 5' (1.524 m)   Wt 86.2 kg   LMP 11/23/2017   SpO2 99%   BMI 37.11 kg/m   Physical Exam  Constitutional: She is oriented to person, place, and time.  Slightly uncomfortable, dehydrated   HENT:  Head: Normocephalic.  MM dry   Eyes: Pupils are equal, round, and reactive to light. EOM are normal.  Cardiovascular: Normal rate, regular rhythm and normal heart sounds.  Pulmonary/Chest: Effort normal and breath sounds normal.  Abdominal: Normal appearance.  +  umbilical tenderness, suprapubic tenderness   Neurological: She is alert and oriented to person, place, and time.  Skin: Skin is warm. Capillary refill takes less than 2 seconds.  Psychiatric: She has a normal mood and affect. Her behavior is normal.  Nursing note and vitals reviewed.    ED Treatments / Results  Labs (all labs ordered are listed, but only abnormal results are displayed) Labs Reviewed  COMPREHENSIVE METABOLIC PANEL - Abnormal; Notable for the following components:      Result Value   Calcium 8.8 (*)    All other components within normal limits  LIPASE, BLOOD  CBC  URINALYSIS, ROUTINE W REFLEX MICROSCOPIC  I-STAT BETA HCG BLOOD, ED ([facility], [facility], [facility] ONLY)    EKG None  Radiology No results found.  Procedures Procedures (including critical care time)  Medications Ordered in ED Medications  sodium chloride 0.9 % bolus 1,000 mL (1,000 mLs Intravenous New Bag/Given 12/24/17 1230)  ondansetron (ZOFRAN)  injection 4 mg (4 mg Intravenous Given 12/24/17 1230)  ketorolac (TORADOL) 30 MG/ML injection 30 mg (30 mg Intravenous Given 12/24/17 1230)  dicyclomine (BENTYL) capsule 10 mg (10 mg Oral Given 12/24/17 1234)     Initial Impression / Assessment and Plan / ED Course  I have reviewed the triage vital signs and the nursing notes.  Pertinent labs & imaging results that were available during my care of the patient were reviewed by me and considered in my medical decision making (see chart for details).     Jennifer Andrews is a 37 y.o. female here with abdominal pain, vomiting, diarrhea. She did eat several bites of uncooked burger but spit it back out. I think likely viral gastro. Will get labs, UA, CT ab/pel.   2:48 PM Labs unremarkable. CT ab/pel unremarkable. No vomiting after zofran. Pain controlled with bentyl, toradol. Will dc home with zofran, bentyl. Likely viral gastro.    Final Clinical Impressions(s) / ED Diagnoses   Final diagnoses:  None    ED Discharge Orders    None       Charlynne PanderYao, David Hsienta, MD 12/24/17 [phone]

## 2017-12-24 NOTE — ED Triage Notes (Addendum)
Patient c/o N/V/D and mid abdominal pain x 3 days. Patient states other family members have the same and symyptoms followed [place]

## 2017-12-24 NOTE — ED Notes (Signed)
Will collect vitals when pt. Return to room. Nurse aware.

## 2017-12-24 NOTE — Discharge Instructions (Signed)
Take zofran as needed for nausea or vomiting.   Take bentyl for cramps.   Stay hydrated.   See your doctor  Return to ER if you have worse abdominal pain, vomiting, fever.

## 2018-01-20 ENCOUNTER — Encounter: Payer: Self-pay | Admitting: *Deleted

## 2018-01-20 ENCOUNTER — Ambulatory Visit (INDEPENDENT_AMBULATORY_CARE_PROVIDER_SITE_OTHER): Payer: Medicaid Other

## 2018-01-20 VITALS — BP 148/89 | HR 93 | Wt 271.2 lb

## 2018-01-20 DIAGNOSIS — Z23 Encounter for immunization: Secondary | ICD-10-CM | POA: Diagnosis not present

## 2018-01-20 NOTE — Progress Notes (Signed)
Pt is here for 3rd gardasil injection. 2nd injection given 09/17/17, pt is on time for 3rd and final injection.

## 2018-05-19 ENCOUNTER — Other Ambulatory Visit: Payer: Self-pay

## 2018-05-19 ENCOUNTER — Emergency Department (HOSPITAL_BASED_OUTPATIENT_CLINIC_OR_DEPARTMENT_OTHER)
Admission: EM | Admit: 2018-05-19 | Discharge: 2018-05-19 | Disposition: A | Discharge status: Home / Self Care | Payer: Medicaid Other | Attending: Emergency Medicine | Admitting: Emergency Medicine

## 2018-05-19 ENCOUNTER — Emergency Department (HOSPITAL_BASED_OUTPATIENT_CLINIC_OR_DEPARTMENT_OTHER): Payer: Medicaid Other

## 2018-05-19 ENCOUNTER — Encounter (HOSPITAL_BASED_OUTPATIENT_CLINIC_OR_DEPARTMENT_OTHER): Payer: Self-pay | Admitting: Adult Health

## 2018-05-19 DIAGNOSIS — R109 Unspecified abdominal pain: Secondary | ICD-10-CM

## 2018-05-19 DIAGNOSIS — S199XXA Unspecified injury of neck, initial encounter: Secondary | ICD-10-CM | POA: Diagnosis not present

## 2018-05-19 DIAGNOSIS — S0990XA Unspecified injury of head, initial encounter: Secondary | ICD-10-CM | POA: Diagnosis not present

## 2018-05-19 DIAGNOSIS — Y92410 Unspecified street and highway as the place of occurrence of the external cause: Secondary | ICD-10-CM | POA: Diagnosis not present

## 2018-05-19 DIAGNOSIS — R51 Headache: Secondary | ICD-10-CM | POA: Insufficient documentation

## 2018-05-19 DIAGNOSIS — Z87891 Personal history of nicotine dependence: Secondary | ICD-10-CM | POA: Insufficient documentation

## 2018-05-19 DIAGNOSIS — S40011A Contusion of right shoulder, initial encounter: Secondary | ICD-10-CM | POA: Insufficient documentation

## 2018-05-19 DIAGNOSIS — Y998 Other external cause status: Secondary | ICD-10-CM | POA: Diagnosis not present

## 2018-05-19 DIAGNOSIS — S161XXA Strain of muscle, fascia and tendon at neck level, initial encounter: Secondary | ICD-10-CM | POA: Diagnosis not present

## 2018-05-19 DIAGNOSIS — Y9389 Activity, other specified: Secondary | ICD-10-CM | POA: Insufficient documentation

## 2018-05-19 DIAGNOSIS — M542 Cervicalgia: Secondary | ICD-10-CM | POA: Diagnosis not present

## 2018-05-19 DIAGNOSIS — S299XXA Unspecified injury of thorax, initial encounter: Secondary | ICD-10-CM | POA: Diagnosis not present

## 2018-05-19 DIAGNOSIS — S3991XA Unspecified injury of abdomen, initial encounter: Secondary | ICD-10-CM | POA: Diagnosis not present

## 2018-05-19 LAB — URINALYSIS, MICROSCOPIC (REFLEX)

## 2018-05-19 LAB — URINALYSIS, ROUTINE W REFLEX MICROSCOPIC
Bilirubin Urine: NEGATIVE
GLUCOSE, UA: NEGATIVE mg/dL
Ketones, ur: NEGATIVE mg/dL
LEUKOCYTES UA: NEGATIVE
Nitrite: NEGATIVE
PROTEIN: NEGATIVE mg/dL
Specific Gravity, Urine: 1.02 (ref 1.005–1.030)
pH: 6 (ref 5.0–8.0)

## 2018-05-19 LAB — PREGNANCY, URINE: Preg Test, Ur: NEGATIVE

## 2018-05-19 MED ORDER — KETOROLAC TROMETHAMINE 15 MG/ML IJ SOLN
15.0000 mg | Freq: Once | INTRAMUSCULAR | Status: AC
  Administered 2018-05-19: 15 mg via INTRAVENOUS
  Filled 2018-05-19: qty 1

## 2018-05-19 MED ORDER — CYCLOBENZAPRINE HCL 10 MG PO TABS
10.0000 mg | ORAL_TABLET | Freq: Once | ORAL | Status: AC
  Administered 2018-05-19: 10 mg via ORAL
  Filled 2018-05-19: qty 1

## 2018-05-19 MED ORDER — ONDANSETRON HCL 4 MG/2ML IJ SOLN
4.0000 mg | Freq: Once | INTRAMUSCULAR | Status: AC
  Administered 2018-05-19: 4 mg via INTRAVENOUS
  Filled 2018-05-19: qty 2

## 2018-05-19 MED ORDER — NAPROXEN 500 MG PO TABS: 500.0000 mg | ORAL_TABLET | Freq: Two times a day (BID) | ORAL | 0 refills | Status: DC

## 2018-05-19 MED ORDER — MORPHINE SULFATE (PF) 4 MG/ML IV SOLN
4.0000 mg | Freq: Once | INTRAVENOUS | Status: AC
  Administered 2018-05-19: 4 mg via INTRAVENOUS
  Filled 2018-05-19: qty 1

## 2018-05-19 MED ORDER — IOPAMIDOL (ISOVUE-300) INJECTION 61%
100.0000 mL | Freq: Once | INTRAVENOUS | Status: AC | PRN
  Administered 2018-05-19: 100 mL via INTRAVENOUS

## 2018-05-19 MED ORDER — CYCLOBENZAPRINE HCL 10 MG PO TABS: 10.0000 mg | ORAL_TABLET | Freq: Two times a day (BID) | ORAL | 0 refills | Status: DC | PRN

## 2018-05-19 MED FILL — NAPROXEN 500 MG TABLET: 500 | 15 days supply | Qty: 30 | Fill #0

## 2018-05-19 MED FILL — CYCLOBENZAPRINE HCL 10 MG T: 10 | 10 days supply | Qty: 20 | Fill #0
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
## 2018-11-04 DIAGNOSIS — Z20828 Contact with and (suspected) exposure to other viral communicable diseases: Secondary | ICD-10-CM | POA: Diagnosis not present

## 2019-06-14 ENCOUNTER — Encounter (HOSPITAL_BASED_OUTPATIENT_CLINIC_OR_DEPARTMENT_OTHER): Payer: Self-pay | Admitting: *Deleted

## 2019-06-14 ENCOUNTER — Other Ambulatory Visit: Payer: Self-pay

## 2019-06-14 ENCOUNTER — Emergency Department (HOSPITAL_BASED_OUTPATIENT_CLINIC_OR_DEPARTMENT_OTHER)
Admission: EM | Admit: 2019-06-14 | Discharge: 2019-06-14 | Disposition: A | Discharge status: Home / Self Care | Payer: Medicaid Other | Attending: Emergency Medicine | Admitting: Emergency Medicine

## 2019-06-14 DIAGNOSIS — Z87891 Personal history of nicotine dependence: Secondary | ICD-10-CM | POA: Diagnosis not present

## 2019-06-14 DIAGNOSIS — L03011 Cellulitis of right finger: Secondary | ICD-10-CM | POA: Diagnosis not present

## 2019-06-14 DIAGNOSIS — M79644 Pain in right finger(s): Secondary | ICD-10-CM | POA: Diagnosis present

## 2019-06-14 MED ORDER — DOXYCYCLINE HYCLATE 100 MG PO TABS
100.0000 mg | ORAL_TABLET | Freq: Once | ORAL | Status: AC
  Administered 2019-06-14: 100 mg via ORAL
  Filled 2019-06-14: qty 1

## 2019-06-14 MED ORDER — IBUPROFEN 800 MG PO TABS
800.0000 mg | ORAL_TABLET | Freq: Once | ORAL | Status: AC
  Administered 2019-06-14: 800 mg via ORAL
  Filled 2019-06-14: qty 1

## 2019-06-14 MED ORDER — DOXYCYCLINE HYCLATE 100 MG PO CAPS: 100.0000 mg | ORAL_CAPSULE | Freq: Two times a day (BID) | ORAL | 0 refills | Status: DC

## 2019-06-14 MED ORDER — LIDOCAINE HCL 2 % IJ SOLN
10.0000 mL | Freq: Once | INTRAMUSCULAR | Status: AC
  Administered 2019-06-14: 200 mg
  Filled 2019-06-14: qty 20

## 2019-06-14 MED ORDER — IBUPROFEN 800 MG PO TABS: 800.0000 mg | ORAL_TABLET | Freq: Four times a day (QID) | ORAL | 0 refills | Status: DC | PRN

## 2019-06-14 NOTE — ED Notes (Signed)
MD with pt  

## 2019-06-14 NOTE — ED Notes (Signed)
MD at bedside. 

## 2019-06-14 NOTE — ED Notes (Signed)
Instructed pt to eat when she gets home. Voiced understanding.

## 2019-06-14 NOTE — ED Provider Notes (Signed)
[facility] EMERGENCY DEPARTMENT Provider Note   CSN: [id number] Arrival date & time: 06/14/19  0433     History Chief Complaint  Patient presents with  . Hand Pain    Jennifer Andrews is a 39 y.o. female.  Patient presents to the emergency department for evaluation of finger pain.  Patient reports that symptoms began approximately a week ago.  Patient complaining of pain and swelling next to the right index finger fingernail.  She denies any direct injury.        Past Medical History:  Diagnosis Date  . CHF (congestive heart failure) ([place]) 2008   Patient states that this was a postpartum diagnosis and she was cleared from this and her cardiomegaly in 2009  . Migraines     There are no problems to display for this patient.   Past Surgical History:  Procedure Laterality Date  . APPENDECTOMY    . CESAREAN SECTION       OB History    Gravida  4   Para  4   Term  4   Preterm      AB      Living  4     SAB      TAB      Ectopic      Multiple      Live Births  4           Family History  Problem Relation Age of Onset  . Asthma Mother   . Stroke Father   . Diabetes Father   . Heart failure Father     Social History   Tobacco Use  . Smoking status: Former Smoker    Types: Cigarettes  . Smokeless tobacco: Never Used  Substance Use Topics  . Alcohol use: No  . Drug use: No    Home Medications Prior to Admission medications   Medication Sig Start Date End Date Taking? Authorizing Provider  cyclobenzaprine (FLEXERIL) 10 MG tablet Take 1 tablet (10 mg total) by mouth 2 (two) times daily as needed for muscle spasms. 05/19/18   Blanchie Dessert, MD  dicyclomine (BENTYL) 20 MG tablet Take 1 tablet (20 mg total) by mouth 3 (three) times daily as needed for spasms. 12/24/17   Drenda Freeze, MD  doxycycline (VIBRAMYCIN) 100 MG capsule Take 1 capsule (100 mg total) by mouth 2 (two) times daily. 06/14/19   Orpah Greek, MD    ibuprofen (ADVIL) 800 MG tablet Take 1 tablet (800 mg total) by mouth every 6 (six) hours as needed for moderate pain. 06/14/19   Orpah Greek, MD  mupirocin ointment (BACTROBAN) 2 % Apply 1 application topically 2 (two) times daily as needed. Patient not taking: Reported on 12/24/2017 08/01/17   Kandis Cocking A, CNM  naproxen (NAPROSYN) 500 MG tablet Take 1 tablet (500 mg total) by mouth 2 (two) times daily. 05/19/18   Blanchie Dessert, MD  ondansetron (ZOFRAN ODT) 4 MG disintegrating tablet 4mg  ODT q4 hours prn nausea/vomit 12/24/17   Drenda Freeze, MD    Allergies    Patient has no known allergies.  Review of Systems   Review of Systems  Skin: Negative for wound.  Neurological: Negative.     Physical Exam Updated Vital Signs BP 125/85 (BP Location: Right Arm)   Pulse 90   Temp 98.4 F (36.9 C) (Oral)   Resp 18   Ht 5' (1.524 m)   Wt 129 kg   LMP 05/10/2019  SpO2 97%   BMI 55.54 kg/m   Physical Exam Vitals and nursing note reviewed.  Constitutional:      Appearance: Normal appearance.  HENT:     Head: Atraumatic.  Musculoskeletal:     Right hand: Tenderness (Right index finger at base of nail and radial side of nail) present. No lacerations. Normal range of motion. Normal strength. Normal sensation.  Skin:    Findings: Erythema (Right index finger radial side of nail and skin proximal to nail) present.  Neurological:     Mental Status: She is alert.     Sensory: Sensation is intact.     Motor: Motor function is intact.     ED Results / Procedures / Treatments   Labs (all labs ordered are listed, but only abnormal results are displayed) Labs Reviewed - No data to display  EKG None  Radiology No results found.  Procedures .Marland KitchenIncision and Drainage  Date/Time: 06/14/2019 5:39 AM Performed by: Gilda Crease, MD Authorized by: Gilda Crease, MD   Consent:    Consent obtained:  Verbal   Consent given by:  Patient   Risks  discussed:  Bleeding, incomplete drainage and pain   Alternatives discussed:  Alternative treatment Universal protocol:    Procedure explained and questions answered to patient or proxy's satisfaction: yes     Site/side marked: yes     Immediately prior to procedure a time out was called: yes     Patient identity confirmed:  Verbally with patient Location:    Type:  Abscess   Location:  Upper extremity   Upper extremity location:  Finger   Finger location:  R index finger Pre-procedure details:    Skin preparation:  Chloraprep Anesthesia (see MAR for exact dosages):    Anesthesia method:  Nerve block   Block location:  Right index finger   Block needle gauge:  27 G   Block anesthetic:  Lidocaine 2% w/o epi   Block technique:  Digital   Block injection procedure:  Anatomic landmarks identified, introduced needle, negative aspiration for blood, incremental injection and anatomic landmarks palpated   Block outcome:  Anesthesia achieved Procedure type:    Complexity:  Simple Procedure details:    Incision types:  Stab incision   Scalpel blade:  11   Drainage:  Bloody   Wound treatment:  Wound left open Post-procedure details:    Patient tolerance of procedure:  Tolerated well, no immediate complications   (including critical care time)  Medications Ordered in ED Medications  ibuprofen (ADVIL) tablet 800 mg (has no administration in time range)  doxycycline (VIBRA-TABS) tablet 100 mg (has no administration in time range)  lidocaine (XYLOCAINE) 2 % (with pres) injection 200 mg (200 mg Infiltration Given by Other 06/14/19 7591)    ED Course  I have reviewed the triage vital signs and the nursing notes.  Pertinent labs & imaging results that were available during my care of the patient were reviewed by me and considered in my medical decision making (see chart for details).    MDM Rules/Calculators/A&P                      Patient presents to the emergency department for  evaluation of nontraumatic finger pain.  Pain has been present for approximately a week and has been slowly worsening.  Patient has acrylic nails in place.  She has some pain, swelling and fluctuance at the base of the nail on the radial side.  I recommended digital block and drainage of suspected paronychia.  This was performed after removing the nail.  She will be placed empirically on doxycycline, return if symptoms worsen.  Final Clinical Impression(s) / ED Diagnoses Final diagnoses:  Paronychia of finger of right hand    Rx / DC Orders ED Discharge Orders         Ordered    doxycycline (VIBRAMYCIN) 100 MG capsule  2 times daily,   Status:  Discontinued     06/14/19 0538    ibuprofen (ADVIL) 800 MG tablet  Every 6 hours PRN     06/14/19 0539    doxycycline (VIBRAMYCIN) 100 MG capsule  2 times daily     06/14/19 0539           Gilda Crease, MD 06/14/19 [phone]

## 2019-06-14 NOTE — ED Triage Notes (Addendum)
C/o right index pain that started one week ago. Denies any injury. No obvious injury noted. Pt c/o swelling to tip of finger. mildly  swollen on exam. Also states index finger itches.  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 Has taken tylenol yesterday and ibuprofen over the past week without relief.

## 2019-08-25 ENCOUNTER — Emergency Department (HOSPITAL_BASED_OUTPATIENT_CLINIC_OR_DEPARTMENT_OTHER)
Admission: EM | Admit: 2019-08-25 | Discharge: 2019-08-25 | Disposition: A | Discharge status: Home / Self Care | Payer: Medicaid Other | Attending: Emergency Medicine | Admitting: Emergency Medicine

## 2019-08-25 ENCOUNTER — Other Ambulatory Visit: Payer: Self-pay

## 2019-08-25 ENCOUNTER — Emergency Department (HOSPITAL_BASED_OUTPATIENT_CLINIC_OR_DEPARTMENT_OTHER): Payer: Medicaid Other

## 2019-08-25 ENCOUNTER — Encounter (HOSPITAL_BASED_OUTPATIENT_CLINIC_OR_DEPARTMENT_OTHER): Payer: Self-pay | Admitting: Emergency Medicine

## 2019-08-25 DIAGNOSIS — Z87891 Personal history of nicotine dependence: Secondary | ICD-10-CM | POA: Insufficient documentation

## 2019-08-25 DIAGNOSIS — R1031 Right lower quadrant pain: Secondary | ICD-10-CM | POA: Diagnosis not present

## 2019-08-25 DIAGNOSIS — R350 Frequency of micturition: Secondary | ICD-10-CM | POA: Diagnosis present

## 2019-08-25 DIAGNOSIS — R319 Hematuria, unspecified: Secondary | ICD-10-CM | POA: Diagnosis not present

## 2019-08-25 DIAGNOSIS — N390 Urinary tract infection, site not specified: Secondary | ICD-10-CM | POA: Insufficient documentation

## 2019-08-25 LAB — URINALYSIS, ROUTINE W REFLEX MICROSCOPIC
Bilirubin Urine: NEGATIVE
Glucose, UA: NEGATIVE mg/dL
Ketones, ur: NEGATIVE mg/dL
Nitrite: NEGATIVE
Protein, ur: 100 mg/dL — AB
Specific Gravity, Urine: >1.03 — ABNORMAL HIGH (ref 1.005–1.030)
pH: 6 (ref 5.0–8.0)

## 2019-08-25 LAB — BASIC METABOLIC PANEL
Anion gap: 8 (ref 5–15)
BUN: 10 mg/dL (ref 6–20)
CO2: 26 mmol/L (ref 22–32)
Calcium: 8.7 mg/dL — ABNORMAL LOW (ref 8.9–10.3)
Chloride: 104 mmol/L (ref 98–111)
Creatinine, Ser: 0.89 mg/dL (ref 0.44–1.00)
GFR calc Af Amer: >60 mL/min (ref >60)
GFR calc non Af Amer: >60 mL/min (ref >60)
Glucose, Bld: 102 mg/dL — ABNORMAL HIGH (ref 70–99)
Potassium: 4.1 mmol/L (ref 3.5–5.1)
Sodium: 138 mmol/L (ref 135–145)

## 2019-08-25 LAB — CBC WITH DIFFERENTIAL/PLATELET
Abs Immature Granulocytes: 0.03 10*3/uL (ref 0.00–0.07)
Basophils Absolute: 0.1 10*3/uL (ref 0.0–0.1)
Basophils Relative: 1 %
Eosinophils Absolute: 0.1 10*3/uL (ref 0.0–0.5)
Eosinophils Relative: 1 %
HCT: 38.3 % (ref 36.0–46.0)
Hemoglobin: 12.8 g/dL (ref 12.0–15.0)
Immature Granulocytes: 0 %
Lymphocytes Relative: 33 %
Lymphs Abs: 2.7 10*3/uL (ref 0.7–4.0)
MCH: 31.8 pg (ref 26.0–34.0)
MCHC: 33.4 g/dL (ref 30.0–36.0)
MCV: 95 fL (ref 80.0–100.0)
Monocytes Absolute: 0.6 10*3/uL (ref 0.1–1.0)
Monocytes Relative: 7 %
Neutro Abs: 4.7 10*3/uL (ref 1.7–7.7)
Neutrophils Relative %: 58 %
Platelets: 270 10*3/uL (ref 150–400)
RBC: 4.03 MIL/uL (ref 3.87–5.11)
RDW: 13.6 % (ref 11.5–15.5)
WBC: 8.2 10*3/uL (ref 4.0–10.5)
nRBC: 0 % (ref 0.0–0.2)

## 2019-08-25 LAB — URINALYSIS, MICROSCOPIC (REFLEX): WBC, UA: >50 WBC/hpf (ref 0–5)

## 2019-08-25 LAB — PREGNANCY, URINE: Preg Test, Ur: NEGATIVE

## 2019-08-25 MED ORDER — ONDANSETRON HCL 4 MG/2ML IJ SOLN
4.0000 mg | Freq: Once | INTRAMUSCULAR | Status: AC
  Administered 2019-08-25: 08:00:00 4 mg via INTRAVENOUS
  Filled 2019-08-25: qty 2

## 2019-08-25 MED ORDER — CEPHALEXIN 500 MG PO CAPS: 500.0000 mg | ORAL_CAPSULE | Freq: Four times a day (QID) | ORAL | 0 refills | Status: DC

## 2019-08-25 MED ORDER — MORPHINE SULFATE (PF) 4 MG/ML IV SOLN
4.0000 mg | Freq: Once | INTRAVENOUS | Status: AC
  Administered 2019-08-25: 4 mg via INTRAVENOUS
  Filled 2019-08-25: qty 1

## 2019-08-25 MED ORDER — SODIUM CHLORIDE 0.9 % IV SOLN
1.0000 g | Freq: Once | INTRAVENOUS | Status: AC
  Administered 2019-08-25: 1 g via INTRAVENOUS
  Filled 2019-08-25: qty 10

## 2019-08-25 MED ORDER — PHENAZOPYRIDINE HCL 200 MG PO TABS: 200.0000 mg | ORAL_TABLET | Freq: Three times a day (TID) | ORAL | 0 refills | Status: DC

## 2019-08-25 MED FILL — PHENAZOPYRIDINE 200 MG TAB: 200 | 2 days supply | Qty: 6 | Fill #0

## 2019-08-25 MED FILL — CEPHALEXIN 500 MG CAPSULE: 500 | 7 days supply | Qty: 28 | Fill #0

## 2019-08-25 NOTE — Discharge Instructions (Signed)
You can take the Pyridium as needed to help with the burning and discomfort of urinating.  Make sure you are staying well-hydrated you can also continue to do cranberry juice.  You can start taking the antibiotic this evening because you got the first dose of IV antibiotic here prior to leaving.

## 2019-08-25 NOTE — ED Triage Notes (Signed)
Pt c/o urinary frequency, hematuria, and lower back pain.

## 2019-08-25 NOTE — ED Provider Notes (Signed)
[facility] EMERGENCY DEPARTMENT Provider Note   CSN: [id number] Arrival date & time: 08/25/19  2549     History Chief Complaint  Patient presents with  . Urinary Frequency    Jennifer Andrews is a 39 y.o. female.  Patient is a 39 year old female who while pregnant had CHF and kidney stones but receives no treatment with medications now presenting today with 1 month of worsening dysuria, frequency, urgency and mild urinary incontinence.  Patient states initially started 1 month ago when she was taking AZO and drinking cranberry juice with some mild improvement however over the last 2 weeks symptoms have significantly worsened.  She now has sharp severe pains that she feels in her urethra every time she tries to urinate as well as significant pressure.  She is now having bilateral flank pain and blood in her urine.  She denies any concerning vaginal discharge and reports she was last sexually active 3 years ago.  When she was pregnant she did require a procedure for kidney stones but has never had any problems since.  She has had nausea and decreased appetite but no vomiting and reports she is taking ibuprofen and Tylenol constantly but does not think she has had a fever.  She does also complain of some suprapubic pain and she has had some mild urinary incontinence.  The history is provided by the patient.  Urinary Frequency This is a new problem. Episode onset: 1 month. The problem occurs constantly. The problem has been gradually worsening. Associated symptoms include abdominal pain. Exacerbated by: urinating. Nothing relieves the symptoms.       Past Medical History:  Diagnosis Date  . CHF (congestive heart failure) (HCC) 2008   Patient states that this was a postpartum diagnosis and she was cleared from this and her cardiomegaly in 2009  . Migraines     There are no problems to display for this patient.   Past Surgical History:  Procedure Laterality Date  .  APPENDECTOMY    . CESAREAN SECTION       OB History    Gravida  4   Para  4   Term  4   Preterm      AB      Living  4     SAB      TAB      Ectopic      Multiple      Live Births  4           Family History  Problem Relation Age of Onset  . Asthma Mother   . Stroke Father   . Diabetes Father   . Heart failure Father     Social History   Tobacco Use  . Smoking status: Former Smoker    Types: Cigarettes  . Smokeless tobacco: Never Used  Substance Use Topics  . Alcohol use: No  . Drug use: No    Home Medications Prior to Admission medications   Not on File    Allergies    Patient has no known allergies.  Review of Systems   Review of Systems  Gastrointestinal: Positive for abdominal pain.  Genitourinary: Positive for frequency.  All other systems reviewed and are negative.   Physical Exam Updated Vital Signs BP (!) 133/102   Pulse 90   Temp 97.9 F (36.6 C) (Oral)   Resp 20   Ht 5\' 1"  (1.549 m)   Wt 127 kg   SpO2 99%   BMI 52.91  kg/m   Physical Exam Vitals and nursing note reviewed.  Constitutional:      General: She is not in acute distress.    Appearance: She is well-developed. She is obese.  HENT:     Head: Normocephalic and atraumatic.  Eyes:     Pupils: Pupils are equal, round, and reactive to light.  Cardiovascular:     Rate and Rhythm: Normal rate and regular rhythm.     Pulses: Normal pulses.     Heart sounds: Normal heart sounds. No murmur. No friction rub.  Pulmonary:     Effort: Pulmonary effort is normal.     Breath sounds: Normal breath sounds. No wheezing or rales.  Abdominal:     General: Bowel sounds are normal. There is no distension.     Palpations: Abdomen is soft.     Tenderness: There is abdominal tenderness in the suprapubic area. There is right CVA tenderness and left CVA tenderness. There is no guarding or rebound.  Musculoskeletal:        General: No tenderness. Normal range of motion.      Comments: No edema  Skin:    General: Skin is warm and dry.     Findings: No rash.  Neurological:     General: No focal deficit present.     Mental Status: She is alert and oriented to person, place, and time. Mental status is at baseline.     Cranial Nerves: No cranial nerve deficit.  Psychiatric:        Mood and Affect: Mood normal.        Behavior: Behavior normal.        Thought Content: Thought content normal.     ED Results / Procedures / Treatments   Labs (all labs ordered are listed, but only abnormal results are displayed) Labs Reviewed  URINALYSIS, ROUTINE W REFLEX MICROSCOPIC - Abnormal; Notable for the following components:      Result Value   APPearance CLOUDY (*)    Specific Gravity, Urine >1.030 (*)    Hgb urine dipstick LARGE (*)    Protein, ur 100 (*)    Leukocytes,Ua SMALL (*)    All other components within normal limits  URINALYSIS, MICROSCOPIC (REFLEX) - Abnormal; Notable for the following components:   Bacteria, UA MANY (*)    All other components within normal limits  BASIC METABOLIC PANEL - Abnormal; Notable for the following components:   Glucose, Bld 102 (*)    Calcium 8.7 (*)    All other components within normal limits  URINE CULTURE  PREGNANCY, URINE  CBC WITH DIFFERENTIAL/PLATELET    EKG None  Radiology CT Renal Stone Study  Result Date: 08/25/2019 CLINICAL DATA:  Flank pain, hematuria EXAM: CT ABDOMEN AND PELVIS WITHOUT CONTRAST TECHNIQUE: Multidetector CT imaging of the abdomen and pelvis was performed following the standard protocol without IV contrast. COMPARISON:  05/19/2018 FINDINGS: Lower chest: No acute abnormality. Hepatobiliary: No focal liver abnormality is seen. No gallstones, gallbladder wall thickening, or biliary dilatation. Pancreas: Unremarkable. No pancreatic ductal dilatation or surrounding inflammatory changes. Spleen: Normal in size without focal abnormality. Adrenals/Urinary Tract: Adrenal glands are unremarkable. Kidneys  are normal, without renal calculi, focal lesion, or hydronephrosis. Bladder is unremarkable. Stomach/Bowel: Stomach is within normal limits. Appendix is surgically absent. No evidence of bowel wall thickening, distention, or inflammatory changes. Vascular/Lymphatic: No significant vascular findings are present. No enlarged abdominal or pelvic lymph nodes. Reproductive: Uterus is unremarkable. Bilateral tubal ligation clips. No adnexal masses. Other: No  abdominal wall hernia or abnormality. No abdominopelvic ascites. Musculoskeletal: No acute or significant osseous findings. Degenerative changes at the bilateral SI joints and pubic symphysis. IMPRESSION: No acute findings in the abdomen or pelvis. Specifically, no renal or ureteral calculi or evidence of obstructive uropathy. Electronically Signed   By: Duanne Guess D.O.   On: 08/25/2019 08:03    Procedures Procedures (including critical care time)  Medications Ordered in ED Medications  morphine 4 MG/ML injection 4 mg (has no administration in time range)  ondansetron (ZOFRAN) injection 4 mg (has no administration in time range)    ED Course  I have reviewed the triage vital signs and the nursing notes.  Pertinent labs & imaging results that were available during my care of the patient were reviewed by me and considered in my medical decision making (see chart for details).    MDM Rules/Calculators/A&P                      39 year old female presenting today with urinary complaints as well as hematuria.  Patient symptoms sound most classic for UTI that is most likely developed into pyelonephritis due to the length of time of her symptoms.  However she has had prior history of kidney stones that at one time required a procedure.  Concerned that she may have a complicated infected stone as well.  Low suspicion for STI, TOA or ovarian torsion as patient has not been sexually active for over 3 years and does not have localized pelvic tenderness.   Symptoms seem to be very urinary in origin.  Urine today has significant red and white blood cells with many bacteria consistent with UTI.  UPT is negative, CBC within normal limits and BMP wnl.  CT renal study to rule out stone.  Patient treated with pain control.  8:38 AM CT is negative for any evidence of stones or pyelonephritis.  Patient given a dose of Rocephin due to her length of symptoms and she will be started on Keflex.  Patient is feeling much better prior to discharge.  MDM Number of Diagnoses or Management Options   Amount and/or Complexity of Data Reviewed Clinical lab tests: ordered and reviewed Tests in the radiology section of CPT: ordered and reviewed Decide to obtain previous medical records or to obtain history from someone other than the patient: yes Obtain history from someone other than the patient: no Review and summarize past medical records: yes Discuss the patient with other providers: no Independent visualization of images, tracings, or specimens: yes  Risk of Complications, Morbidity, and/or Mortality Presenting problems: low Diagnostic procedures: minimal Management options: minimal  Patient Progress Patient progress: improved   Final Clinical Impression(s) / ED Diagnoses Final diagnoses:  Lower urinary tract infectious disease    Rx / DC Orders ED Discharge Orders         Ordered    cephALEXin (KEFLEX) 500 MG capsule  4 times daily     08/25/19 0839    phenazopyridine (PYRIDIUM) 200 MG tablet  3 times daily     08/25/19 2671           Gwyneth Sprout, MD 08/25/19 0840

## 2019-08-27 LAB — URINE CULTURE: Culture: >=100000 — AB

## 2019-08-28 ENCOUNTER — Telehealth: Payer: Self-pay | Admitting: Emergency Medicine

## 2019-08-28 NOTE — Telephone Encounter (Signed)
Post ED Visit - Positive Culture Follow-up  Culture report reviewed by antimicrobial stewardship pharmacist: [facility] []  , Pharm.D. []  Enzo Bi, Pharm.D., BCPS AQ-ID []  , Pharm.D., BCPS []  Celedonio Miyamoto, Pharm.D., BCPS []  Dale, Garvin Fila.D., BCPS, AAHIVP []  , Pharm.D., BCPS, AAHIVP []  Georgina Pillion, PharmD, BCPS []  , PharmD, BCPS []  [place], PharmD, BCPS []  [address], PharmD []  , PharmD, BCPS []  Estella Husk, PharmD  Pharmacy Team []  Lysle Pearl, PharmD []  , PharmD []  Phillips Climes, PharmD []  , Rph []  Agapito Games) , PharmD []  Verlan Friends, PharmD []  , PharmD []  Mervyn Gay, PharmD []  , PharmD []  Vinnie Level, PharmD []  [facility], PharmD []  , PharmD [x]  Len Childs, PharmD   Positive urine culture Treated with Cephalexin, organism sensitive to the same and no further patient follow-up is required at this time.  Aadil Sur 08/28/2019, 10:59 AM

## 2019-10-04 ENCOUNTER — Emergency Department (HOSPITAL_COMMUNITY): Payer: Medicaid Other

## 2019-10-04 ENCOUNTER — Emergency Department (HOSPITAL_COMMUNITY)
Admission: EM | Admit: 2019-10-04 | Discharge: 2019-10-04 | Disposition: A | Discharge status: Home / Self Care | Payer: Medicaid Other | Attending: Emergency Medicine | Admitting: Emergency Medicine

## 2019-10-04 ENCOUNTER — Encounter (HOSPITAL_COMMUNITY): Payer: Self-pay

## 2019-10-04 DIAGNOSIS — S63501A Unspecified sprain of right wrist, initial encounter: Secondary | ICD-10-CM

## 2019-10-04 DIAGNOSIS — Y9341 Activity, dancing: Secondary | ICD-10-CM | POA: Diagnosis not present

## 2019-10-04 DIAGNOSIS — Z87891 Personal history of nicotine dependence: Secondary | ICD-10-CM | POA: Diagnosis not present

## 2019-10-04 DIAGNOSIS — Y999 Unspecified external cause status: Secondary | ICD-10-CM | POA: Diagnosis not present

## 2019-10-04 DIAGNOSIS — S39012A Strain of muscle, fascia and tendon of lower back, initial encounter: Secondary | ICD-10-CM | POA: Diagnosis not present

## 2019-10-04 DIAGNOSIS — X503XXA Overexertion from repetitive movements, initial encounter: Secondary | ICD-10-CM | POA: Insufficient documentation

## 2019-10-04 DIAGNOSIS — Y92019 Unspecified place in single-family (private) house as the place of occurrence of the external cause: Secondary | ICD-10-CM | POA: Insufficient documentation

## 2019-10-04 DIAGNOSIS — I509 Heart failure, unspecified: Secondary | ICD-10-CM | POA: Diagnosis not present

## 2019-10-04 DIAGNOSIS — S638X1A Sprain of other part of right wrist and hand, initial encounter: Secondary | ICD-10-CM | POA: Diagnosis not present

## 2019-10-04 DIAGNOSIS — S3992XA Unspecified injury of lower back, initial encounter: Secondary | ICD-10-CM | POA: Diagnosis present

## 2019-10-04 DIAGNOSIS — M25531 Pain in right wrist: Secondary | ICD-10-CM | POA: Diagnosis not present

## 2019-10-04 MED ORDER — IBUPROFEN 800 MG PO TABS: 800.0000 mg | ORAL_TABLET | Freq: Three times a day (TID) | ORAL | 0 refills | Status: DC | PRN

## 2019-10-04 MED ORDER — METHOCARBAMOL 500 MG PO TABS: 500.0000 mg | ORAL_TABLET | Freq: Three times a day (TID) | ORAL | 0 refills | Status: DC | PRN

## 2019-10-04 NOTE — Progress Notes (Signed)
Orthopedic Tech Progress Note Patient Details:  Jennifer Andrews June 16, 1980 [id number]  Ortho Devices Type of Ortho Device: Thumb velcro splint Ortho Device/Splint Location: RUE Ortho Device/Splint Interventions: Ordered, Application   Post Interventions Patient Tolerated: Well Instructions Provided: Care of device, Adjustment of device   Michail Boyte N Tayana Shankle 10/04/2019, 1:45 PM

## 2019-10-04 NOTE — ED Provider Notes (Signed)
[facility]-EMERGENCY DEPT Provider Note   CSN: [id number] Arrival date & time: 10/04/19  1205     History Chief Complaint  Patient presents with  . Back Pain  . Wrist Pain    Jennifer Andrews is a 39 y.o. female.  She is here for evaluation of complaints of low back pain and right wrist pain.  She said she was doing Tic[employer]oc videos with her nieces and nephews yesterday doing a lot of dance moves.  Today she is noticing pain and spasm in her low back and some pain with movement of her right wrist.  There was no specific trauma.  No abdominal pain or urinary symptoms.  No numbness or weakness.  She is tried nothing for it.  The history is provided by the patient.  Back Pain Location:  Lumbar spine Quality:  Stabbing Radiates to:  Does not radiate Pain severity:  Moderate Onset quality:  Gradual Timing:  Intermittent Progression:  Unchanged Chronicity:  New Relieved by:  None tried Worsened by:  Movement and twisting Ineffective treatments:  None tried Associated symptoms: no abdominal pain, no bladder incontinence, no bowel incontinence, no chest pain, no dysuria, no fever, no numbness and no weakness   Risk factors: obesity   Risk factors: no hx of cancer   Wrist Pain This is a new problem. The problem occurs constantly. The problem has not changed since onset.Pertinent negatives include no chest pain and no abdominal pain. The symptoms are aggravated by twisting and bending. The symptoms are relieved by position. She has tried nothing for the symptoms. The treatment provided no relief.       Past Medical History:  Diagnosis Date  . CHF (congestive heart failure) (HCC) 2008   Patient states that this was a postpartum diagnosis and she was cleared from this and her cardiomegaly in 2009  . Migraines     There are no problems to display for this patient.   Past Surgical History:  Procedure Laterality Date  . APPENDECTOMY    . CESAREAN SECTION        OB History    Gravida  4   Para  4   Term  4   Preterm      AB      Living  4     SAB      TAB      Ectopic      Multiple      Live Births  4           Family History  Problem Relation Age of Onset  . Asthma Mother   . Stroke Father   . Diabetes Father   . Heart failure Father     Social History   Tobacco Use  . Smoking status: Former Smoker    Types: Cigarettes  . Smokeless tobacco: Never Used  Substance Use Topics  . Alcohol use: No  . Drug use: No    Home Medications Prior to Admission medications   Medication Sig Start Date End Date Taking? Authorizing Provider  cephALEXin (KEFLEX) 500 MG capsule Take 1 capsule (500 mg total) by mouth 4 (four) times daily. Start first dose tonight 4/13 08/25/19   Gwyneth Sprout, MD  phenazopyridine (PYRIDIUM) 200 MG tablet Take 1 tablet (200 mg total) by mouth 3 (three) times daily. 08/25/19   Gwyneth Sprout, MD    Allergies    Patient has no known allergies.  Review of Systems   Review of Systems  Constitutional: Negative for fever.  Cardiovascular: Negative for chest pain.  Gastrointestinal: Negative for abdominal pain and bowel incontinence.  Genitourinary: Negative for bladder incontinence and dysuria.  Musculoskeletal: Positive for back pain.  Skin: Negative for wound.  Neurological: Negative for weakness and numbness.    Physical Exam Updated Vital Signs BP 96/76 (BP Location: Left Arm)   Pulse 86   Temp 98.7 F (37.1 C) (Oral)   Resp 18   LMP 08/27/2019 (Approximate)   SpO2 97%   Physical Exam Constitutional:      Appearance: She is well-developed.  HENT:     Head: Normocephalic and atraumatic.  Eyes:     Conjunctiva/sclera: Conjunctivae normal.  Musculoskeletal:        General: Tenderness present. No deformity.     Cervical back: Neck supple.     Comments: She has some vague tenderness in her right wrist with movement.  No snuffbox tenderness.  Moves digits easily, normal  sensation and motor.  Radial pulse 2+.  Low back has no cervical or thoracic tenderness.  Diffuse lumbar paralumbar tenderness.  Normal gait.  Skin:    General: Skin is warm and dry.     Capillary Refill: Capillary refill takes less than 2 seconds.  Neurological:     General: No focal deficit present.     Mental Status: She is alert.     GCS: GCS eye subscore is 4. GCS verbal subscore is 5. GCS motor subscore is 6.     Sensory: No sensory deficit.     Motor: No weakness.     Gait: Gait normal.     ED Results / Procedures / Treatments   Labs (all labs ordered are listed, but only abnormal results are displayed) Labs Reviewed - No data to display  EKG None  Radiology DG Wrist Complete Right  Result Date: 10/04/2019 CLINICAL DATA:  Wrist pain. EXAM: RIGHT WRIST - COMPLETE 3+ VIEW COMPARISON:  None. FINDINGS: There is no definite evidence of displaced fracture. Perceived step-off in the cortex of the neck of the scaphoid. Soft tissues are normal. IMPRESSION: Perceived step-off in the cortex of the neck of the scaphoid may represent a nondisplaced fracture or overlapping shadows. Please correlate to point of tenderness. Electronically Signed   By: Fidela Salisbury M.D.   On: 10/04/2019 13:33    Procedures Procedures (including critical care time)  Medications Ordered in ED Medications - No data to display  ED Course  I have reviewed the triage vital signs and the nursing notes.  Pertinent labs & imaging results that were available during my care of the patient were reviewed by me and considered in my medical decision making (see chart for details).  Clinical Course as of Oct 04 1654  Sun Oct 04, 2019  1354 Radiology is calling possible lucency in the scaphoid.  I examined her and she did not have particular snuffbox tenderness.  Placed her in a Velcro splint with thumb immobilization.  Patient instructed to follow-up with hand surgery if continued pain in the wrist.   [MB]      Clinical Course User Index [MB] Hayden Rasmussen, MD   MDM Rules/Calculators/A&P                     Differential diagnosis includes fracture, strain, dislocation.  Final Clinical Impression(s) / ED Diagnoses Final diagnoses:  Strain of lumbar region, initial encounter  Sprain of right wrist, initial encounter    Rx / DC Orders ED  Discharge Orders         Ordered    ibuprofen (ADVIL) 800 MG tablet  Every 8 hours PRN,   Status:  Discontinued     10/04/19 1327    methocarbamol (ROBAXIN) 500 MG tablet  Every 8 hours PRN     10/04/19 1327    ibuprofen (ADVIL) 800 MG tablet  Every 8 hours PRN     10/04/19 1327           Terrilee Files, MD 10/04/19 1657

## 2019-10-04 NOTE — ED Triage Notes (Signed)
Pt presents with c/o right wrist pain and low back pain. Pt denies any specific injury to either area but reports she was playing with her nieces and nephews yesterday.

## 2019-10-04 NOTE — Discharge Instructions (Signed)
You were seen in the emergency department for evaluation of right wrist and low back pain after dancing.  Your x-ray of your right wrist did not show any obvious fracture.  We are treating you with some ibuprofen and a muscle relaxant.  Please follow-up with your regular doctor.  Return to the emergency department for any worsening or concerning symptoms

## 2019-10-07 DIAGNOSIS — M545 Low back pain: Secondary | ICD-10-CM | POA: Diagnosis not present

## 2019-10-07 DIAGNOSIS — M25531 Pain in right wrist: Secondary | ICD-10-CM | POA: Diagnosis not present

## 2020-01-08 DIAGNOSIS — Z20822 Contact with and (suspected) exposure to covid-19: Secondary | ICD-10-CM | POA: Diagnosis not present

## 2020-01-21 DIAGNOSIS — Z20822 Contact with and (suspected) exposure to covid-19: Secondary | ICD-10-CM | POA: Diagnosis not present

## 2020-08-16 ENCOUNTER — Emergency Department (HOSPITAL_BASED_OUTPATIENT_CLINIC_OR_DEPARTMENT_OTHER): Payer: Medicaid Other

## 2020-08-16 ENCOUNTER — Emergency Department (HOSPITAL_BASED_OUTPATIENT_CLINIC_OR_DEPARTMENT_OTHER)
Admission: EM | Admit: 2020-08-16 | Discharge: 2020-08-17 | Disposition: A | Discharge status: Home / Self Care | Payer: Medicaid Other | Attending: Emergency Medicine | Admitting: Emergency Medicine

## 2020-08-16 ENCOUNTER — Encounter (HOSPITAL_BASED_OUTPATIENT_CLINIC_OR_DEPARTMENT_OTHER): Payer: Self-pay | Admitting: *Deleted

## 2020-08-16 ENCOUNTER — Other Ambulatory Visit: Payer: Self-pay

## 2020-08-16 DIAGNOSIS — Z87891 Personal history of nicotine dependence: Secondary | ICD-10-CM | POA: Diagnosis not present

## 2020-08-16 DIAGNOSIS — R0602 Shortness of breath: Secondary | ICD-10-CM | POA: Insufficient documentation

## 2020-08-16 DIAGNOSIS — I509 Heart failure, unspecified: Secondary | ICD-10-CM | POA: Insufficient documentation

## 2020-08-16 DIAGNOSIS — Z79899 Other long term (current) drug therapy: Secondary | ICD-10-CM | POA: Insufficient documentation

## 2020-08-16 DIAGNOSIS — R109 Unspecified abdominal pain: Secondary | ICD-10-CM | POA: Insufficient documentation

## 2020-08-16 DIAGNOSIS — G8929 Other chronic pain: Secondary | ICD-10-CM | POA: Diagnosis not present

## 2020-08-16 DIAGNOSIS — R0789 Other chest pain: Secondary | ICD-10-CM | POA: Diagnosis not present

## 2020-08-16 LAB — CBC WITH DIFFERENTIAL/PLATELET
Abs Immature Granulocytes: 0.03 10*3/uL (ref 0.00–0.07)
Basophils Absolute: 0 10*3/uL (ref 0.0–0.1)
Basophils Relative: 0 %
Eosinophils Absolute: 0.1 10*3/uL (ref 0.0–0.5)
Eosinophils Relative: 1 %
HCT: 40.6 % (ref 36.0–46.0)
Hemoglobin: 13.5 g/dL (ref 12.0–15.0)
Immature Granulocytes: 0 %
Lymphocytes Relative: 33 %
Lymphs Abs: 3.5 10*3/uL (ref 0.7–4.0)
MCH: 31 pg (ref 26.0–34.0)
MCHC: 33.3 g/dL (ref 30.0–36.0)
MCV: 93.3 fL (ref 80.0–100.0)
Monocytes Absolute: 0.6 10*3/uL (ref 0.1–1.0)
Monocytes Relative: 6 %
Neutro Abs: 6.3 10*3/uL (ref 1.7–7.7)
Neutrophils Relative %: 60 %
Platelets: 311 10*3/uL (ref 150–400)
RBC: 4.35 MIL/uL (ref 3.87–5.11)
RDW: 13.2 % (ref 11.5–15.5)
WBC: 10.6 10*3/uL — ABNORMAL HIGH (ref 4.0–10.5)
nRBC: 0 % (ref 0.0–0.2)

## 2020-08-16 LAB — COMPREHENSIVE METABOLIC PANEL
ALT: 20 U/L (ref 0–44)
AST: 27 U/L (ref 15–41)
Albumin: 3.8 g/dL (ref 3.5–5.0)
Alkaline Phosphatase: 58 U/L (ref 38–126)
Anion gap: 8 (ref 5–15)
BUN: 9 mg/dL (ref 6–20)
CO2: 25 mmol/L (ref 22–32)
Calcium: 8.9 mg/dL (ref 8.9–10.3)
Chloride: 103 mmol/L (ref 98–111)
Creatinine, Ser: 0.83 mg/dL (ref 0.44–1.00)
GFR, Estimated: >60 mL/min (ref >60)
Glucose, Bld: 92 mg/dL (ref 70–99)
Potassium: 3.9 mmol/L (ref 3.5–5.1)
Sodium: 136 mmol/L (ref 135–145)
Total Bilirubin: 0.4 mg/dL (ref 0.3–1.2)
Total Protein: 7.5 g/dL (ref 6.5–8.1)

## 2020-08-16 LAB — LIPASE, BLOOD: Lipase: 35 U/L (ref 11–51)

## 2020-08-16 LAB — TROPONIN I (HIGH SENSITIVITY): Troponin I (High Sensitivity): <2 ng/L (ref <18)

## 2020-08-16 MED ORDER — MORPHINE SULFATE (PF) 4 MG/ML IV SOLN
4.0000 mg | Freq: Once | INTRAVENOUS | Status: AC
  Administered 2020-08-16: 4 mg via INTRAVENOUS
  Filled 2020-08-16: qty 1

## 2020-08-16 MED ORDER — METHOCARBAMOL 500 MG PO TABS: 500.0000 mg | ORAL_TABLET | Freq: Three times a day (TID) | ORAL | 0 refills | Status: DC | PRN

## 2020-08-16 MED ORDER — NAPROXEN 375 MG PO TABS: 375.0000 mg | ORAL_TABLET | Freq: Two times a day (BID) | ORAL | 0 refills | Status: DC

## 2020-08-16 NOTE — ED Provider Notes (Signed)
[facility] EMERGENCY DEPARTMENT Provider Note   CSN: [id number] Arrival date & time: 08/16/20  2022     History Chief Complaint  Patient presents with  . Shortness of Breath    Jennifer Andrews is a 40 y.o. female with past medical history of postpartum cardiomyopathy, chronic pain that presents the emergency department today for shortness of breath, chest pain for the past 2 weeks.  Patient states that she had her daughter in 2014 and was diagnosed with cardiomyopathy then, follow with a cardiologist in [place] who had cleared her in regards to cardiac work-up after following her for multiple years.  States that she has not seen a cardiologist in many years.  States that she has been having chest pain and shortness of breath for multiple years, is intermittent.  Has not seen anyone for this.  States that the chest pain is on the right side of her chest, pretty constant. States that it worsened over the past 2 weeks to the point where she cannot work.  States that when this occurs she is short of breath due to the pain.  No difficulty breathing.  No fevers or chills.  No cough or URI symptoms.  She states that she also has had chronic abdominal pain since 2014 when she had a tubal ligation, no new abdominal pain, no nausea or vomiting, no diarrhea.  States that she has not seen anyone for this either.  Takes Tylenol for the pain, for the last 2 weeks has been taking more Tylenol than normal due to the pain in her chest.  No more than appropriate daily dose. Chest pain feels like a pressure on the right side, sometimes pleuritic but not always, does not radiate anywhere, not worse with exertion.  Primarily concerned of the chest pain therefore she came to the emergency department.  No other complaints at this time.  Wants referral to GI doctor for her chronic abdominal pain.  HPI     Past Medical History:  Diagnosis Date  . CHF (congestive heart failure) (HCC) 2008   Patient states  that this was a postpartum diagnosis and she was cleared from this and her cardiomegaly in 2009  . Migraines     There are no problems to display for this patient.   Past Surgical History:  Procedure Laterality Date  . APPENDECTOMY    . CESAREAN SECTION       OB History    Gravida  4   Para  4   Term  4   Preterm      AB      Living  4     SAB      IAB      Ectopic      Multiple      Live Births  4           Family History  Problem Relation Age of Onset  . Asthma Mother   . Stroke Father   . Diabetes Father   . Heart failure Father     Social History   Tobacco Use  . Smoking status: Former Smoker    Types: Cigarettes  . Smokeless tobacco: Never Used  Vaping Use  . Vaping Use: Never used  Substance Use Topics  . Alcohol use: No  . Drug use: No    Home Medications Prior to Admission medications   Medication Sig Start Date End Date Taking? Authorizing Provider  naproxen (NAPROSYN) 375 MG tablet Take 1 tablet (  375 mg total) by mouth 2 (two) times daily. 08/16/20  Yes Ximena Todaro, PA-C  cephALEXin (KEFLEX) 500 MG capsule Take 1 capsule (500 mg total) by mouth 4 (four) times daily. Start first dose tonight 4/13 Patient not taking: No sig reported 08/25/19   Gwyneth SproutPlunkett, Whitney, MD  ibuprofen (ADVIL) 800 MG tablet Take 1 tablet (800 mg total) by mouth every 8 (eight) hours as needed. 10/04/19   Terrilee FilesButler, Michael C, MD  methocarbamol (ROBAXIN) 500 MG tablet Take 1 tablet (500 mg total) by mouth every 8 (eight) hours as needed for muscle spasms. 08/16/20   Farrel GordonPatel, Jessey Stehlin, PA-C  phenazopyridine (PYRIDIUM) 200 MG tablet Take 1 tablet (200 mg total) by mouth 3 (three) times daily. Patient not taking: No sig reported 08/25/19   Gwyneth SproutPlunkett, Whitney, MD    Allergies    Patient has no known allergies.  Review of Systems   Review of Systems  Constitutional: Negative for chills, diaphoresis, fatigue and fever.  HENT: Negative for congestion, sore throat and trouble  swallowing.   Eyes: Negative for pain and visual disturbance.  Respiratory: Positive for shortness of breath. Negative for cough and wheezing.   Cardiovascular: Positive for chest pain. Negative for palpitations and leg swelling.  Gastrointestinal: Positive for abdominal pain. Negative for abdominal distention, diarrhea, nausea and vomiting.  Genitourinary: Negative for difficulty urinating.  Musculoskeletal: Negative for back pain, neck pain and neck stiffness.  Skin: Negative for pallor.  Neurological: Negative for dizziness, speech difficulty, weakness and headaches.  Psychiatric/Behavioral: Negative for confusion.    Physical Exam Updated Vital Signs BP 100/85   Pulse 80   Temp 98.6 F (37 C) (Oral)   Resp 20   Ht 5' (1.524 m)   Wt 127.4 kg   LMP 07/20/2020   SpO2 100%   BMI 54.84 kg/m   Physical Exam Constitutional:      General: She is not in acute distress.    Appearance: Normal appearance. She is not ill-appearing, toxic-appearing or diaphoretic.  HENT:     Mouth/Throat:     Mouth: Mucous membranes are moist.     Pharynx: Oropharynx is clear.  Eyes:     General: No scleral icterus.    Extraocular Movements: Extraocular movements intact.     Pupils: Pupils are equal, round, and reactive to light.  Cardiovascular:     Rate and Rhythm: Normal rate and regular rhythm.     Pulses: Normal pulses.     Heart sounds: Normal heart sounds.  Pulmonary:     Effort: Pulmonary effort is normal. No respiratory distress.     Breath sounds: Normal breath sounds. No stridor. No wheezing, rhonchi or rales.  Chest:     Chest wall: No tenderness.       Comments: Tenderness to palpation around right breast as depicted above.  No erythema or rashes.  No masses of breast, no nipple discharge. Abdominal:     General: Abdomen is flat. There is no distension.     Palpations: Abdomen is soft.     Tenderness: There is no abdominal tenderness. There is no guarding or rebound.      Comments: Mild generalized abdominal tenderness.  No guarding.  Musculoskeletal:        General: No swelling or tenderness. Normal range of motion.     Cervical back: Normal range of motion and neck supple. No rigidity.     Right lower leg: No edema.     Left lower leg: No edema.  Skin:  General: Skin is warm and dry.     Capillary Refill: Capillary refill takes less than 2 seconds.     Coloration: Skin is not pale.  Neurological:     General: No focal deficit present.     Mental Status: She is alert and oriented to person, place, and time.  Psychiatric:        Mood and Affect: Mood normal.        Behavior: Behavior normal.     ED Results / Procedures / Treatments   Labs (all labs ordered are listed, but only abnormal results are displayed) Labs Reviewed  CBC WITH DIFFERENTIAL/PLATELET - Abnormal; Notable for the following components:      Result Value   WBC 10.6 (*)    All other components within normal limits  COMPREHENSIVE METABOLIC PANEL  LIPASE, BLOOD  TROPONIN I (HIGH SENSITIVITY)    EKG None  Radiology DG Chest 2 View  Result Date: 08/16/2020 CLINICAL DATA:  Shortness of breath EXAM: CHEST - 2 VIEW COMPARISON:  05/19/2018 FINDINGS: Heart and mediastinal contours are within normal limits. No focal opacities or effusions. No acute bony abnormality. IMPRESSION: No active cardiopulmonary disease. Electronically Signed   By: Charlett Nose M.D.   On: 08/16/2020 23:18    Procedures Procedures   Medications Ordered in ED Medications  morphine 4 MG/ML injection 4 mg (4 mg Intravenous Given 08/16/20 2252)    ED Course  I have reviewed the triage vital signs and the nursing notes.  Pertinent labs & imaging results that were available during my care of the patient were reviewed by me and considered in my medical decision making (see chart for details).    MDM Rules/Calculators/A&P                         Jennifer Andrews is a 40 y.o. female with past medical history  of postpartum cardiomyopathy, chronic pain that presents the emergency department today for shortness of breath, chest pain for the past 2 weeks.  Low likelihood of ACS, chest pain is reproducible and has been there for multiple years, has been constant for the past 2 weeks. PERC negative HEAR score 1.    Work-up today unremarkable, troponin less than 2.  Do not need second troponin since chest pain has been constant for the past 2 weeks.  CMP and CBC unremarkable.  Lipase normal.  EKG G interpreted me with any signs of ischemia.  Chest x-ray interpreted me with any signs of cardiopulmonary disease.  Upon reevaluation, patient states that she feels better after morphine.  States that she feels much better after hearing her results, will follow up with her PCP.  Also wants referral to cardiologist. Abdomen with mild generalized abdominal tenderness, no guarding, no leukocytosis or lipase elevation.  This is a chronic problem, will have patient follow-up with GI doctor for this.   Doubt need for further emergent work up at this time. I explained the diagnosis and have given explicit precautions to return to the ER including for any other new or worsening symptoms. The patient understands and accepts the medical plan as it's been dictated and I have answered their questions. Discharge instructions concerning home care and prescriptions have been given. The patient is STABLE and is discharged to home in good condition.   Final Clinical Impression(s) / ED Diagnoses Final diagnoses:  Chest wall pain    Rx / DC Orders ED Discharge Orders  Ordered    methocarbamol (ROBAXIN) 500 MG tablet  Every 8 hours PRN        08/16/20 2332    naproxen (NAPROSYN) 375 MG tablet  2 times daily        08/16/20 2352           Farrel Gordon, PA-C 08/16/20 2358    Arby Barrette, MD 08/24/20 1133

## 2020-08-16 NOTE — Discharge Instructions (Signed)
Your work-up today was reassuring, as we discussed your chest pain is most likely musculoskeletal in nature.  Most likely chest wall pain, you can use the attached instructions about more on this.  I want you to take the Robaxin and naproxen as directed.  I also want you to follow-up with your primary care, also referred you to a cardiologist and a GI doctor that you can make appointments with if your symptoms worsen.  Please come back to the emerge department for any new or worsening concerning symptoms.  Please speak to pharmacist about any new medications prescribed today in regards to side effects or interactions with other medications.    Get help right away if: You feel sick to your stomach (nauseous) or you throw up (vomit). You feel sweaty or light-headed. You have a cough with mucus from your lungs (sputum) or you cough up blood. You are short of breath.

## 2020-08-16 NOTE — ED Notes (Signed)
Patient transported to X-ray 

## 2020-08-16 NOTE — ED Triage Notes (Signed)
Sob abdominal pain, and pain under her right breast that is worse when she lays down for the past week.

## 2020-08-29 DIAGNOSIS — G43909 Migraine, unspecified, not intractable, without status migrainosus: Secondary | ICD-10-CM | POA: Insufficient documentation

## 2020-09-01 ENCOUNTER — Ambulatory Visit: Payer: Medicaid Other | Admitting: Cardiology

## 2020-09-02 ENCOUNTER — Other Ambulatory Visit: Payer: Self-pay | Admitting: Physician Assistant

## 2020-09-02 DIAGNOSIS — M461 Sacroiliitis, not elsewhere classified: Secondary | ICD-10-CM | POA: Diagnosis not present

## 2020-09-02 DIAGNOSIS — R102 Pelvic and perineal pain: Secondary | ICD-10-CM | POA: Diagnosis not present

## 2020-09-02 DIAGNOSIS — R1013 Epigastric pain: Secondary | ICD-10-CM

## 2020-09-02 DIAGNOSIS — K5900 Constipation, unspecified: Secondary | ICD-10-CM | POA: Diagnosis not present

## 2020-09-19 ENCOUNTER — Ambulatory Visit
Admission: RE | Admit: 2020-09-19 | Discharge: 2020-09-19 | Disposition: A | Discharge status: Home / Self Care | Payer: Medicaid Other | Source: Ambulatory Visit | Attending: Physician Assistant | Admitting: Physician Assistant

## 2020-09-19 DIAGNOSIS — R102 Pelvic and perineal pain: Secondary | ICD-10-CM | POA: Diagnosis not present

## 2020-09-19 DIAGNOSIS — R1013 Epigastric pain: Secondary | ICD-10-CM

## 2020-09-19 DIAGNOSIS — R1030 Lower abdominal pain, unspecified: Secondary | ICD-10-CM | POA: Diagnosis not present

## 2020-09-23 ENCOUNTER — Other Ambulatory Visit: Payer: Self-pay

## 2020-09-26 ENCOUNTER — Encounter: Payer: Self-pay | Admitting: Cardiology

## 2020-09-26 ENCOUNTER — Other Ambulatory Visit: Payer: Self-pay

## 2020-09-26 ENCOUNTER — Ambulatory Visit: Payer: Medicaid Other | Admitting: Cardiology

## 2020-09-26 VITALS — BP 118/82 | HR 88 | Ht 59.0 in | Wt 285.0 lb

## 2020-09-26 DIAGNOSIS — E785 Hyperlipidemia, unspecified: Secondary | ICD-10-CM | POA: Diagnosis not present

## 2020-09-26 DIAGNOSIS — R0602 Shortness of breath: Secondary | ICD-10-CM | POA: Diagnosis not present

## 2020-09-26 DIAGNOSIS — O903 Peripartum cardiomyopathy: Secondary | ICD-10-CM | POA: Diagnosis not present

## 2020-09-26 DIAGNOSIS — F32A Depression, unspecified: Secondary | ICD-10-CM

## 2020-09-26 DIAGNOSIS — R091 Pleurisy: Secondary | ICD-10-CM

## 2020-09-26 DIAGNOSIS — E8881 Metabolic syndrome: Secondary | ICD-10-CM

## 2020-09-26 DIAGNOSIS — R079 Chest pain, unspecified: Secondary | ICD-10-CM | POA: Diagnosis not present

## 2020-09-26 DIAGNOSIS — R9431 Abnormal electrocardiogram [ECG] [EKG]: Secondary | ICD-10-CM

## 2020-09-26 MED ORDER — METOPROLOL TARTRATE 100 MG PO TABS: ORAL_TABLET | ORAL | 0 refills | Status: DC

## 2020-09-26 NOTE — Patient Instructions (Addendum)
Medication Instructions:  Your physician recommends that you continue on your current medications as directed. Please refer to the Current Medication list given to you today.  *If you need a refill on your cardiac medications before your next appointment, please call your pharmacy*   Lab Work: Your physician recommends that you return for lab work:  TODAY: Lipids, CRP, ESR, Hbg A1C 3-7 days before CT scan:  BMET, Mag If you have labs (blood work) drawn today and your tests are completely normal, you will receive your results only by: Marland Kitchen MyChart Message (if you have MyChart) OR . A paper copy in the mail If you have any lab test that is abnormal or we need to change your treatment, we will call you to review the results.   Testing/Procedures: Your physician has requested that you have an echocardiogram. Echocardiography is a painless test that uses sound waves to create images of your heart. It provides your doctor with information about the size and shape of your heart and how well your heart's chambers and valves are working. This procedure takes approximately one hour. There are no restrictions for this procedure.  Your cardiac CT will be scheduled at one:  [facility] [address] [place], [place] [address] [phone]    If scheduled at [facility], please arrive at the [facility] main entrance (entrance A) of [facility] 30 minutes prior to test start time. Proceed to the [facility] Radiology Department (first floor) to check-in and test prep.  Please follow these instructions carefully (unless otherwise directed):   On the Night Before the Test: . Be sure to Drink plenty of water. . Do not consume any caffeinated/decaffeinated beverages or chocolate 12 hours prior to your test. . Do not take any antihistamines 12 hours prior to your test.   On the Day of the Test: . Drink plenty of water until 1 hour prior to the test. . Do not eat any  food 4 hours prior to the test. . You may take your regular medications prior to the test.  . Take metoprolol (Lopressor) two hours prior to test. . FEMALES- please wear underwire-free bra if available       After the Test: . Drink plenty of water. . After receiving IV contrast, you may experience a mild flushed feeling. This is normal. . On occasion, you may experience a mild rash up to 24 hours after the test. This is not dangerous. If this occurs, you can take Benadryl 25 mg and increase your fluid intake. . If you experience trouble breathing, this can be serious. If it is severe call 911 IMMEDIATELY. If it is mild, please call our office. . If you take any of these medications: Glipizide/Metformin, Avandament, Glucavance, please do not take 48 hours after completing test unless otherwise instructed.   Once we have confirmed authorization from your insurance company, we will call you to set up a date and time for your test. Based on how quickly your insurance processes prior authorizations requests, please allow up to 4 weeks to be contacted for scheduling your Cardiac CT appointment. Be advised that routine Cardiac CT appointments could be scheduled as many as 8 weeks after your provider has ordered it.  For non-scheduling related questions, please contact the cardiac imaging nurse navigator should you have any questions/concerns: Marchia Bond, Cardiac Imaging Nurse Navigator Gordy Clement, Cardiac Imaging Nurse Navigator [facility] Direct Office Dial: [phone]   For  scheduling needs, including cancellations and rescheduling, please call [place], [phone].    Follow-Up: At [facility], you and your health needs are our priority.  As part of our continuing mission to provide you with exceptional heart care, we have created designated Provider Care Teams.  These Care Teams include your primary Cardiologist (physician) and Advanced Practice  Providers (APPs -  Physician Assistants and Nurse Practitioners) who all work together to provide you with the care you need, when you need it.  We recommend signing up for the patient portal called "MyChart".  Sign up information is provided on this After Visit Summary.  MyChart is used to connect with patients for Virtual Visits (Telemedicine).  Patients are able to view lab/test results, encounter notes, upcoming appointments, etc.  Non-urgent messages can be sent to your provider as well.   To learn more about what you can do with MyChart, go to [email or web address].    Your next appointment:   3 month(s)  The format for your next appointment:   In Person  Provider:   Berniece Salines, DO   Other Instructions  Echocardiogram An echocardiogram is a test that uses sound waves (ultrasound) to produce images of the heart. Images from an echocardiogram can provide important information about:  Heart size and shape.  The size and thickness and movement of your heart's walls.  Heart muscle function and strength.  Heart valve function or if you have stenosis. Stenosis is when the heart valves are too narrow.  If blood is flowing backward through the heart valves (regurgitation).  A tumor or infectious growth around the heart valves.  Areas of heart muscle that are not working well because of poor blood flow or injury from a heart attack.  Aneurysm detection. An aneurysm is a weak or damaged part of an artery wall. The wall bulges out from the normal force of blood pumping through the body. Tell a health care provider about:  Any allergies you have.  All medicines you are taking, including vitamins, herbs, eye drops, creams, and over-the-counter medicines.  Any blood disorders you have.  Any surgeries you have had.  Any medical conditions you have.  Whether you are pregnant or may be pregnant. What are the risks? Generally, this is a safe test. However, problems may occur,  including an allergic reaction to dye (contrast) that may be used during the test. What happens before the test? No specific preparation is needed. You may eat and drink normally. What happens during the test?  You will take off your clothes from the waist up and put on a hospital gown.  Electrodes or electrocardiogram (ECG)patches may be placed on your chest. The electrodes or patches are then connected to a device that monitors your heart rate and rhythm.  You will lie down on a table for an ultrasound exam. A gel will be applied to your chest to help sound waves pass through your skin.  A handheld device, called a transducer, will be pressed against your chest and moved over your heart. The transducer produces sound waves that travel to your heart and bounce back (or "echo" back) to the transducer. These sound waves will be captured in real-time and changed into images of your heart that can be viewed on a video monitor. The images will be recorded on a computer and reviewed by your health care provider.  You may be asked to change positions or hold your breath for a short time. This makes it easier to get  different views or better views of your heart.  In some cases, you may receive contrast through an IV in one of your veins. This can improve the quality of the pictures from your heart. The procedure may vary among health care providers and hospitals.   What can I expect after the test? You may return to your normal, everyday life, including diet, activities, and medicines, unless your health care provider tells you not to do that. Follow these instructions at home:  It is up to you to get the results of your test. Ask your health care provider, or the department that is doing the test, when your results will be ready.  Keep all follow-up visits. This is important. Summary  An echocardiogram is a test that uses sound waves (ultrasound) to produce images of the heart.  Images from an  echocardiogram can provide important information about the size and shape of your heart, heart muscle function, heart valve function, and other possible heart problems.  You do not need to do anything to prepare before this test. You may eat and drink normally.  After the echocardiogram is completed, you may return to your normal, everyday life, unless your health care provider tells you not to do that. This information is not intended to replace advice given to you by your health care provider. Make sure you discuss any questions you have with your health care provider. Document Revised: 12/22/2019 Document Reviewed: 12/22/2019 Elsevier Patient Education  2021 [employer].
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
## 2020-09-27 LAB — C-REACTIVE PROTEIN: CRP: 4 mg/L (ref 0–10)

## 2020-09-27 LAB — LIPID PANEL
Chol/HDL Ratio: 5.6 ratio — ABNORMAL HIGH (ref 0.0–4.4)
Cholesterol, Total: 218 mg/dL — ABNORMAL HIGH (ref 100–199)
HDL: 39 mg/dL — ABNORMAL LOW (ref >39)
LDL Chol Calc (NIH): 137 mg/dL — ABNORMAL HIGH (ref 0–99)
Triglycerides: 233 mg/dL — ABNORMAL HIGH (ref 0–149)
VLDL Cholesterol Cal: 42 mg/dL — ABNORMAL HIGH (ref 5–40)

## 2020-09-27 LAB — SEDIMENTATION RATE: Sed Rate: 23 mm/hr (ref 0–32)

## 2020-09-29 ENCOUNTER — Other Ambulatory Visit: Payer: Self-pay

## 2020-09-29 MED ORDER — ROSUVASTATIN CALCIUM 10 MG PO TABS: 10.0000 mg | ORAL_TABLET | Freq: Every day | ORAL | 3 refills | Status: DC

## 2020-10-03 DIAGNOSIS — R102 Pelvic and perineal pain: Secondary | ICD-10-CM | POA: Diagnosis not present

## 2020-10-03 DIAGNOSIS — K5904 Chronic idiopathic constipation: Secondary | ICD-10-CM | POA: Diagnosis not present

## 2020-10-05 ENCOUNTER — Ambulatory Visit (HOSPITAL_BASED_OUTPATIENT_CLINIC_OR_DEPARTMENT_OTHER): Admission: RE | Admit: 2020-10-05 | Payer: Medicaid Other | Source: Ambulatory Visit

## 2020-10-12 ENCOUNTER — Telehealth (HOSPITAL_COMMUNITY): Payer: Self-pay | Admitting: *Deleted

## 2020-10-12 NOTE — Telephone Encounter (Signed)
Attempted to call patient regarding upcoming cardiac CT appointment. °Left message on voicemail with name and callback number ° °Coral Soler RN Navigator Cardiac Imaging °[facility] °[phone] Office °[phone] Cell ° °

## 2020-10-13 ENCOUNTER — Ambulatory Visit (HOSPITAL_COMMUNITY): Admission: RE | Admit: 2020-10-13 | Payer: Medicaid Other | Source: Ambulatory Visit

## 2020-10-19 ENCOUNTER — Telehealth (HOSPITAL_COMMUNITY): Payer: Self-pay | Admitting: Emergency Medicine

## 2020-10-19 NOTE — Telephone Encounter (Signed)
Reaching out to patient to offer assistance regarding upcoming cardiac imaging study; pt verbalizes understanding of appt date/time, parking situation and where to check in, pre-test NPO status and medications ordered, and verified current allergies; name and call back number provided for further questions should they arise [name] RN Navigator Cardiac Imaging [facility] [phone] office [phone] cell  100mg metoprolol tart 2 hr prior to scan Jennifer Andrews  

## 2020-10-20 ENCOUNTER — Ambulatory Visit (HOSPITAL_COMMUNITY)
Admission: RE | Admit: 2020-10-20 | Discharge: 2020-10-20 | Disposition: A | Discharge status: Home / Self Care | Payer: Medicaid Other | Source: Ambulatory Visit | Attending: Cardiology | Admitting: Cardiology

## 2020-10-20 ENCOUNTER — Other Ambulatory Visit: Payer: Self-pay

## 2020-10-20 ENCOUNTER — Telehealth: Payer: Self-pay | Admitting: Cardiology

## 2020-10-20 ENCOUNTER — Encounter (HOSPITAL_COMMUNITY): Payer: Self-pay

## 2020-10-20 ENCOUNTER — Ambulatory Visit (HOSPITAL_COMMUNITY): Payer: Medicaid Other

## 2020-10-20 DIAGNOSIS — R079 Chest pain, unspecified: Secondary | ICD-10-CM | POA: Diagnosis not present

## 2020-10-20 MED ORDER — METOPROLOL TARTRATE 5 MG/5ML IV SOLN
INTRAVENOUS | Status: AC
  Filled 2020-10-20: qty 10

## 2020-10-20 MED ORDER — METOPROLOL TARTRATE 5 MG/5ML IV SOLN: 5.0000 mg | INTRAVENOUS | Status: DC | PRN

## 2020-10-20 MED ORDER — NITROGLYCERIN 0.4 MG SL SUBL
SUBLINGUAL_TABLET | SUBLINGUAL | Status: AC
  Filled 2020-10-20: qty 1

## 2020-10-20 MED ORDER — NITROGLYCERIN 0.4 MG SL SUBL
0.8000 mg | SUBLINGUAL_TABLET | Freq: Once | SUBLINGUAL | Status: AC
  Administered 2020-10-20: 0.8 mg via SUBLINGUAL

## 2020-10-20 MED ORDER — IOHEXOL 350 MG/ML SOLN
100.0000 mL | Freq: Once | INTRAVENOUS | Status: AC | PRN
  Administered 2020-10-20: 100 mL via INTRAVENOUS

## 2020-10-20 NOTE — Telephone Encounter (Signed)
Did not need this encounter °

## 2020-11-03 ENCOUNTER — Other Ambulatory Visit: Payer: Self-pay

## 2020-11-03 ENCOUNTER — Ambulatory Visit (HOSPITAL_BASED_OUTPATIENT_CLINIC_OR_DEPARTMENT_OTHER)
Admission: RE | Admit: 2020-11-03 | Discharge: 2020-11-03 | Disposition: A | Discharge status: Home / Self Care | Payer: Medicaid Other | Source: Ambulatory Visit | Attending: Cardiology | Admitting: Cardiology

## 2020-11-03 ENCOUNTER — Ambulatory Visit (HOSPITAL_BASED_OUTPATIENT_CLINIC_OR_DEPARTMENT_OTHER): Payer: Medicaid Other

## 2020-11-03 DIAGNOSIS — O903 Peripartum cardiomyopathy: Secondary | ICD-10-CM

## 2020-11-03 DIAGNOSIS — R0602 Shortness of breath: Secondary | ICD-10-CM

## 2020-11-03 LAB — ECHOCARDIOGRAM COMPLETE
AR max vel: 1.8 cm2
AV Area VTI: 1.56 cm2
AV Area mean vel: 1.69 cm2
AV Mean grad: 4 mmHg
AV Peak grad: 6.5 mmHg
Ao pk vel: 1.27 m/s
Area-P 1/2: 3.99 cm2
Calc EF: 72.7 %
S' Lateral: 2.5 cm
Single Plane A2C EF: 81 %
Single Plane A4C EF: 61.2 %

## 2020-11-03 NOTE — Progress Notes (Signed)
*  PRELIMINARY RESULTS* Echocardiogram 2D Echocardiogram has been performed.  Neomia Dear RDCS 11/03/2020, 2:37 PM

## 2020-11-04 ENCOUNTER — Telehealth: Payer: Self-pay | Admitting: Cardiology

## 2020-11-04 NOTE — Telephone Encounter (Signed)
Spoke with patient, see chart.    

## 2020-11-04 NOTE — Telephone Encounter (Signed)
Pt returning call In regards to echo please advise

## 2020-11-29 ENCOUNTER — Ambulatory Visit (INDEPENDENT_AMBULATORY_CARE_PROVIDER_SITE_OTHER): Payer: Self-pay | Admitting: Family Medicine

## 2020-12-13 ENCOUNTER — Ambulatory Visit (INDEPENDENT_AMBULATORY_CARE_PROVIDER_SITE_OTHER): Payer: Self-pay | Admitting: Family Medicine

## 2020-12-26 ENCOUNTER — Ambulatory Visit: Payer: Medicaid Other | Admitting: Psychology

## 2020-12-29 ENCOUNTER — Ambulatory Visit: Payer: Medicaid Other | Admitting: Cardiology

## 2021-01-02 ENCOUNTER — Ambulatory Visit: Payer: Medicaid Other | Admitting: Psychology

## 2021-01-03 ENCOUNTER — Other Ambulatory Visit (HOSPITAL_BASED_OUTPATIENT_CLINIC_OR_DEPARTMENT_OTHER): Payer: Self-pay

## 2021-01-03 ENCOUNTER — Other Ambulatory Visit: Payer: Self-pay

## 2021-01-03 ENCOUNTER — Emergency Department (HOSPITAL_BASED_OUTPATIENT_CLINIC_OR_DEPARTMENT_OTHER)
Admission: EM | Admit: 2021-01-03 | Discharge: 2021-01-03 | Disposition: A | Discharge status: Home / Self Care | Payer: BC Managed Care – PPO | Attending: Emergency Medicine | Admitting: Emergency Medicine

## 2021-01-03 ENCOUNTER — Other Ambulatory Visit (HOSPITAL_COMMUNITY): Payer: Self-pay

## 2021-01-03 ENCOUNTER — Encounter (HOSPITAL_BASED_OUTPATIENT_CLINIC_OR_DEPARTMENT_OTHER): Payer: Self-pay | Admitting: *Deleted

## 2021-01-03 ENCOUNTER — Emergency Department (HOSPITAL_BASED_OUTPATIENT_CLINIC_OR_DEPARTMENT_OTHER): Payer: BC Managed Care – PPO

## 2021-01-03 DIAGNOSIS — M545 Low back pain, unspecified: Secondary | ICD-10-CM | POA: Insufficient documentation

## 2021-01-03 DIAGNOSIS — Z87891 Personal history of nicotine dependence: Secondary | ICD-10-CM | POA: Diagnosis not present

## 2021-01-03 DIAGNOSIS — G8929 Other chronic pain: Secondary | ICD-10-CM | POA: Insufficient documentation

## 2021-01-03 DIAGNOSIS — I509 Heart failure, unspecified: Secondary | ICD-10-CM | POA: Insufficient documentation

## 2021-01-03 MED ORDER — KETOROLAC TROMETHAMINE 60 MG/2ML IM SOLN
60.0000 mg | Freq: Once | INTRAMUSCULAR | Status: AC
  Administered 2021-01-03: 60 mg via INTRAMUSCULAR
  Filled 2021-01-03: qty 2

## 2021-01-03 MED ORDER — DICLOFENAC SODIUM 75 MG PO TBEC
75.0000 mg | DELAYED_RELEASE_TABLET | Freq: Two times a day (BID) | ORAL | 0 refills | Status: DC
  Filled 2021-01-03: qty 30, 15d supply, fill #0

## 2021-01-03 NOTE — ED Provider Notes (Signed)
[facility] EMERGENCY DEPARTMENT Provider Note   CSN: [id number] Arrival date & time: 01/03/21  1214     History Chief Complaint  Patient presents with   Back Pain    Jennifer Andrews is a 40 y.o. female.  The history is provided by the patient. No language interpreter was used.  Back Pain Location:  Lumbar spine Radiates to:  Does not radiate Pain severity:  Moderate Onset quality:  Gradual Duration: years. Timing:  Constant Progression:  Worsening Chronicity:  Recurrent Relieved by:  Nothing Worsened by:  Nothing Ineffective treatments:  None tried Associated symptoms: no abdominal pain and no fever   Risk factors: not pregnant       Past Medical History:  Diagnosis Date   CHF (congestive heart failure) (HCC) 2008   Patient states that this was a postpartum diagnosis and she was cleared from this and her cardiomegaly in 2009   Migraines     Patient Active Problem List   Diagnosis Date Noted   Migraines    CHF (congestive heart failure) (HCC) 2008    Past Surgical History:  Procedure Laterality Date   APPENDECTOMY     CESAREAN SECTION       OB History     Gravida  4   Para  4   Term  4   Preterm      AB      Living  4      SAB      IAB      Ectopic      Multiple      Live Births  4           Family History  Problem Relation Age of Onset   Asthma Mother    Stroke Father    Diabetes Father    Heart failure Father     Social History   Tobacco Use   Smoking status: Former    Types: Cigarettes   Smokeless tobacco: Never  Vaping Use   Vaping Use: Never used  Substance Use Topics   Alcohol use: No   Drug use: No    Home Medications Prior to Admission medications   Medication Sig Start Date End Date Taking? Authorizing Provider  diclofenac (VOLTAREN) 75 MG EC tablet Take 1 tablet (75 mg total) by mouth 2 (two) times daily. 01/03/21  Yes Cheron Schaumann K, PA-C  methocarbamol (ROBAXIN) 500 MG tablet Take 1  tablet (500 mg total) by mouth every 8 (eight) hours as needed for muscle spasms. 08/16/20   Farrel Gordon, PA-C  metoprolol tartrate (LOPRESSOR) 100 MG tablet Take 2 hours prior to CT 09/26/20   Tobb, Kardie, DO  naproxen (NAPROSYN) 375 MG tablet Take 1 tablet (375 mg total) by mouth 2 (two) times daily. 08/16/20   Farrel Gordon, PA-C  pantoprazole (PROTONIX) 40 MG tablet Take 1 tablet by mouth 2 (two) times daily. 09/02/20   [provider]  phenazopyridine (PYRIDIUM) 200 MG tablet Take 1 tablet (200 mg total) by mouth 3 (three) times daily. 08/25/19   Gwyneth Sprout, MD  rosuvastatin (CRESTOR) 10 MG tablet Take 1 tablet (10 mg total) by mouth daily. 09/29/20 12/28/20  Tobb, Lavona Mound, DO    Allergies    Patient has no known allergies.  Review of Systems   Review of Systems  Constitutional:  Negative for fever.  Gastrointestinal:  Negative for abdominal pain.  Musculoskeletal:  Positive for back pain.  All other systems reviewed and are negative.  Physical Exam Updated Vital Signs BP (!) 154/116 (BP Location: Right Arm)   Pulse (!) 107   Temp 98.4 F (36.9 C) (Oral)   Resp 20   Ht 4\' 11"  (1.499 m)   Wt 129.3 kg   LMP 12/10/2020   SpO2 93%   BMI 57.57 kg/m   Physical Exam Vitals and nursing note reviewed.  Constitutional:      General: She is not in acute distress.    Appearance: Normal appearance. She is well-developed.  HENT:     Head: Normocephalic and atraumatic.  Eyes:     Conjunctiva/sclera: Conjunctivae normal.  Cardiovascular:     Rate and Rhythm: Normal rate and regular rhythm.     Heart sounds: No murmur heard. Pulmonary:     Effort: Pulmonary effort is normal. No respiratory distress.     Breath sounds: Normal breath sounds.  Abdominal:     Palpations: Abdomen is soft.     Tenderness: There is no abdominal tenderness.  Musculoskeletal:        General: No swelling or tenderness.     Cervical back: Neck supple.  Skin:    General: Skin is warm and dry.   Neurological:     General: No focal deficit present.     Mental Status: She is alert.    ED Results / Procedures / Treatments   Labs (all labs ordered are listed, but only abnormal results are displayed) Labs Reviewed - No data to display  EKG None  Radiology DG Lumbar Spine Complete  Result Date: 01/03/2021 CLINICAL DATA:  Back pain EXAM: LUMBAR SPINE - COMPLETE 4+ VIEW COMPARISON:  None. FINDINGS: There is no evidence of lumbar spine fracture. Alignment is normal. Intervertebral disc spaces are maintained. Mild facet arthrosis of the lower lumbar spine. IMPRESSION: No acute osseous abnormality. Mild facet arthrosis of the lower lumbar spine. Electronically Signed   By: 01/05/2021 M.D.   On: 01/03/2021 13:52    Procedures Procedures   Medications Ordered in ED Medications  ketorolac (TORADOL) injection 60 mg (60 mg Intramuscular Given 01/03/21 1339)    ED Course  I have reviewed the triage vital signs and the nursing notes.  Pertinent labs & imaging results that were available during my care of the patient were reviewed by me and considered in my medical decision making (see chart for details).    MDM Rules/Calculators/A&P                           MDM:  Pt given injection of toradol.  Xray  no acute.  Pt advised to follow up with Dr. 01/05/21  Final Clinical Impression(s) / ED Diagnoses Final diagnoses:  Chronic bilateral low back pain, unspecified whether sciatica present    Rx / DC Orders ED Discharge Orders          Ordered    diclofenac (VOLTAREN) 75 MG EC tablet  2 times daily        01/03/21 1416          An After Visit Summary was printed and given to the patient.    01/05/21, PA-C 01/03/21 1425    [address] DO 01/03/21 1436

## 2021-01-03 NOTE — Discharge Instructions (Addendum)
Schedule primary care check up.  See Dr. Noreene Filbert for evaluation

## 2021-01-03 NOTE — ED Triage Notes (Signed)
Lower back pain. Hx chronic back pain. No known injury.

## 2021-01-04 ENCOUNTER — Ambulatory Visit: Payer: Medicaid Other | Admitting: Psychology

## 2021-01-04 ENCOUNTER — Telehealth: Payer: Self-pay

## 2021-01-04 NOTE — Telephone Encounter (Signed)
Transition Care Management Unsuccessful Follow-up Telephone Call  Date of discharge and from where:  01/03/2021-[facility]  Attempts:  1st Attempt  Reason for unsuccessful TCM follow-up call:  Left voice message

## 2021-01-05 NOTE — Telephone Encounter (Signed)
Transition Care Management Unsuccessful Follow-up Telephone Call  Date of discharge and from where:  01/03/2021-[facility]   Attempts:  2nd Attempt  Reason for unsuccessful TCM follow-up call:  Left voice message

## 2021-01-08 NOTE — Telephone Encounter (Signed)
Transition Care Management Unsuccessful Follow-up Telephone Call  Date of discharge and from where:  01/03/2021 from [facility]  Attempts:  3rd Attempt  Reason for unsuccessful TCM follow-up call:  Unable to reach patient

## 2021-01-20 ENCOUNTER — Ambulatory Visit: Payer: Medicaid Other | Admitting: Cardiology

## 2021-01-23 ENCOUNTER — Encounter: Payer: Self-pay | Admitting: Cardiology

## 2021-01-31 ENCOUNTER — Ambulatory Visit (INDEPENDENT_AMBULATORY_CARE_PROVIDER_SITE_OTHER): Payer: BC Managed Care – PPO | Admitting: Psychology

## 2021-01-31 DIAGNOSIS — F321 Major depressive disorder, single episode, moderate: Secondary | ICD-10-CM | POA: Diagnosis not present

## 2021-02-13 ENCOUNTER — Ambulatory Visit (INDEPENDENT_AMBULATORY_CARE_PROVIDER_SITE_OTHER): Payer: BC Managed Care – PPO | Admitting: Psychology

## 2021-02-13 DIAGNOSIS — F321 Major depressive disorder, single episode, moderate: Secondary | ICD-10-CM | POA: Diagnosis not present

## 2021-02-28 ENCOUNTER — Ambulatory Visit: Payer: Medicaid Other | Admitting: Psychology

## 2021-03-02 ENCOUNTER — Ambulatory Visit: Payer: Medicaid Other | Admitting: Psychology

## 2021-03-15 ENCOUNTER — Ambulatory Visit: Payer: Medicaid Other | Admitting: Psychology

## 2021-11-29 IMAGING — CT CT HEART MORP W/ CTA COR W/ SCORE W/ CA W/CM &/OR W/O CM
4 of 7 series · 8 of 20 positions shown, 9 images · non-contrast
Comparison: None.
COMPARISON: None.

Addendum:
EXAM:
OVER-READ INTERPRETATION  CT CHEST

The following report is an over-read performed by radiologist Dr.
Kaki Jim [[email or web address]] on 10/20/2020. This over-read
does not include interpretation of cardiac or coronary anatomy or
pathology. The coronary CTA interpretation by the cardiologist is
attached.
CLINICAL DATA: 39 year old with chest pain
Cardiac/Coronary  CTA
TECHNIQUE: The patient was scanned on a [employer] Force scanner.

[Series 7: best diast 77 % · axial · 0.39mm/px · z∈[-169,-136]mm · 2 of 246 slices shown]
[im 82/246  vessel]
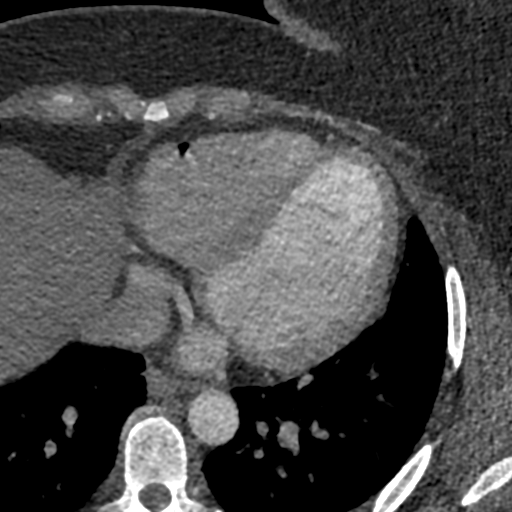
[im 164/246  vessel]
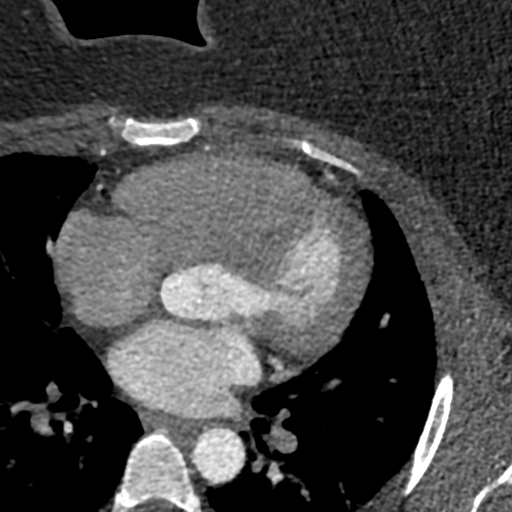

[Series 8: best syst · axial · 0.39mm/px · z∈[-169,-136]mm · 2 of 246 slices shown, 3 images]
[im 82/246  vessel]
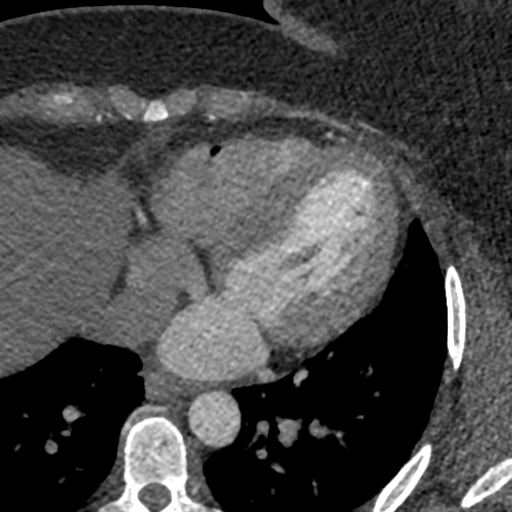
[im 82/246  lung]
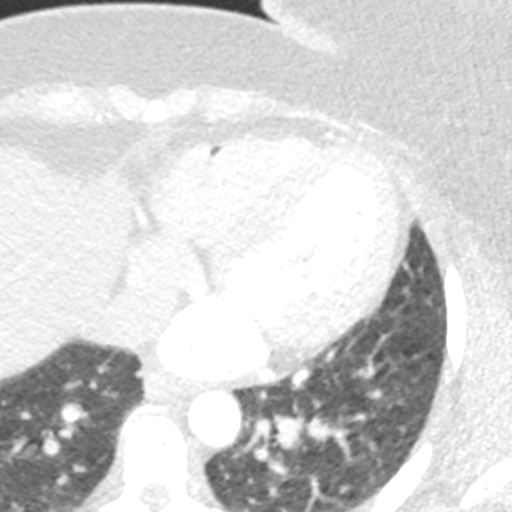
[im 164/246  vessel]
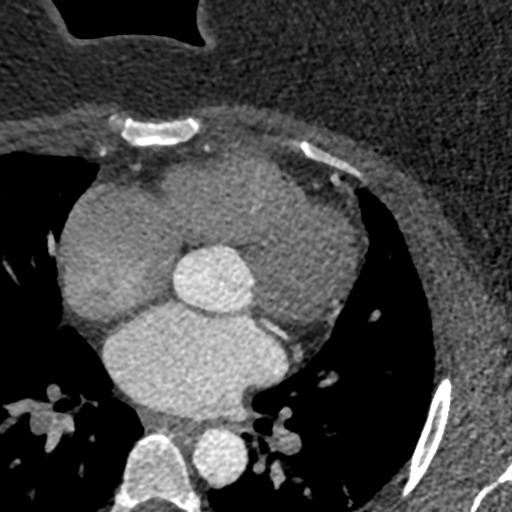

[Series 9: ts diast sharp · axial · 0.39mm/px · z∈[-169,-136]mm · 2 of 246 slices shown]
[im 82/246  lung]
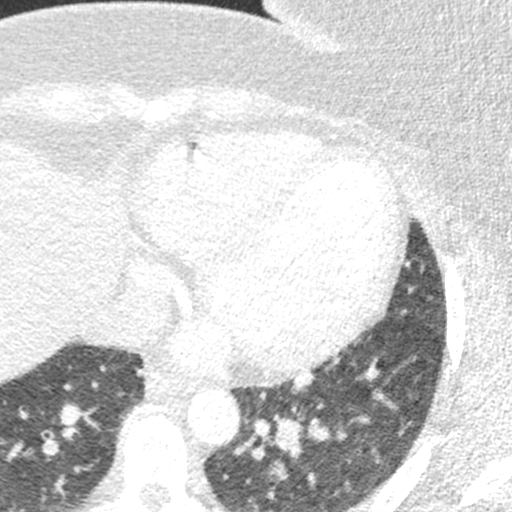
[im 164/246  lung]
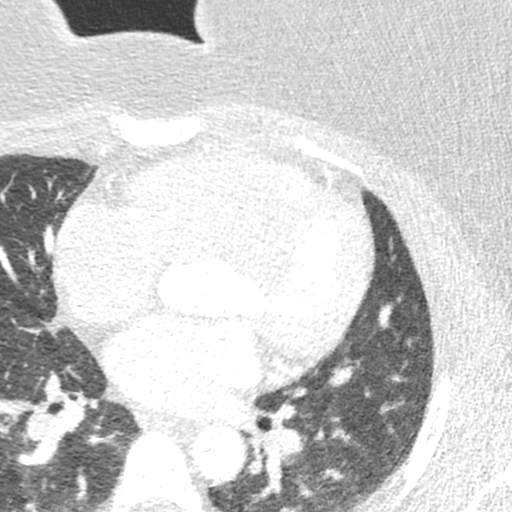

[Series 10: ts syst sharp · axial · 0.39mm/px · z∈[-169,-136]mm · 2 of 246 slices shown]
[im 82/246  lung]
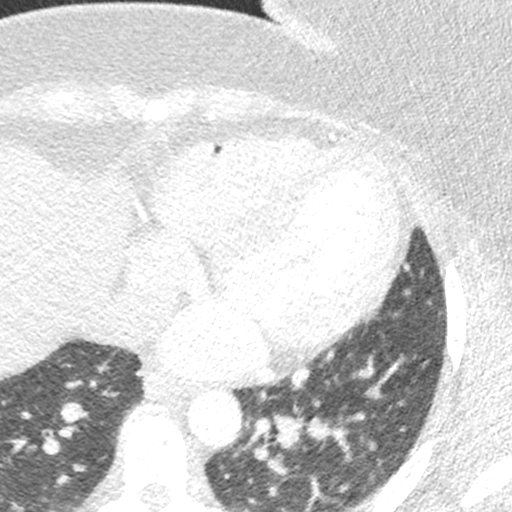
[im 164/246  lung]
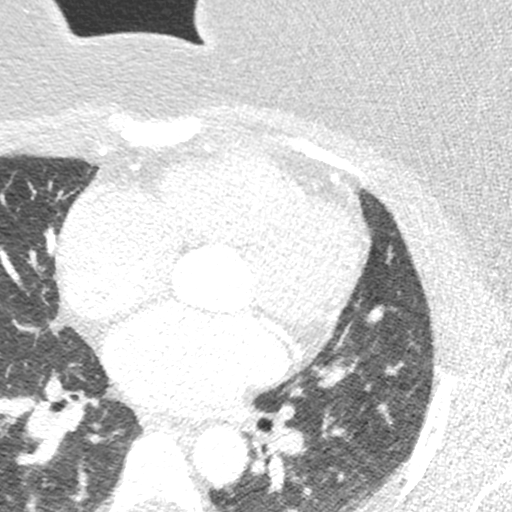

[8 of 20 positions shown; findings below may reference images not displayed]

FINDINGS: Vascular: Heart is normal size. Aorta is normal caliber.

Mediastinum/Nodes: No adenopathy

Lungs/Pleura: No confluent opacities or effusions.

Upper Abdomen: Diffuse low-density throughout the liver suggests
fatty infiltration. No acute findings.

Musculoskeletal: Chest wall soft tissues are unremarkable. No acute
bony abnormality.
IMPRESSION: Suspect hepatic steatosis.

No acute extra cardiac abnormality.
FINDINGS: A 130 kV prospective scan was triggered in the descending thoracic
aorta at 111 HU's. Axial non-contrast 3 mm slices were carried out
through the heart. The data set was analyzed on a dedicated work
station and scored using the Agatson method. Gantry rotation speed
was 250 msecs and collimation was .6 mm. 0.8 mg of sl NTG was given.
The 3D data set was reconstructed in 5% intervals of the 67-82 % of
the R-R cycle. Diastolic phases were analyzed on a dedicated work
station using MPR, MIP and VRT modes. The patient received 80 cc of
contrast.

Image quality - interpretable

Aorta:  Normal size.  No calcifications.  No dissection.

Aortic Valve:  Trileaflet.  No calcifications.

Coronary Arteries:  Normal coronary origin.  Right dominance.

RCA is a large dominant artery that gives rise to PDA and PLA. There
is no plaque.

Left main is a large artery that gives rise to LAD and LCX arteries.

LAD is a large vessel that has no plaque.

LCX is a non-dominant artery that gives rise to one large OM1
branch. There is no plaque.

Other findings:

Normal pulmonary vein drainage into the left atrium.

Normal left atrial appendage without a thrombus.

Normal size of the pulmonary artery.

Please see radiology report for non cardiac findings.
IMPRESSION: 1. Coronary calcium score of 0. This was 0 percentile for age and
sex matched control.

2. Normal coronary origin with right dominance.

3. No evidence of CAD.

*** End of Addendum ***
EXAM:
OVER-READ INTERPRETATION  CT CHEST

The following report is an over-read performed by radiologist Dr.
Kaki Jim [REDACTED] on 10/20/2020. This over-read
does not include interpretation of cardiac or coronary anatomy or
pathology. The coronary CTA interpretation by the cardiologist is
attached.
FINDINGS: Vascular: Heart is normal size. Aorta is normal caliber.

Mediastinum/Nodes: No adenopathy

Lungs/Pleura: No confluent opacities or effusions.

Upper Abdomen: Diffuse low-density throughout the liver suggests
fatty infiltration. No acute findings.

Musculoskeletal: Chest wall soft tissues are unremarkable. No acute
bony abnormality.
IMPRESSION: Suspect hepatic steatosis.

No acute extra cardiac abnormality.

## 2021-12-13 ENCOUNTER — Other Ambulatory Visit (HOSPITAL_BASED_OUTPATIENT_CLINIC_OR_DEPARTMENT_OTHER): Payer: Self-pay

## 2022-01-23 NOTE — Progress Notes (Signed)
ANNUAL EXAM Patient name: Jennifer Andrews MRN [id number]  Date of birth: 09/28/1980 Chief Complaint:   Annual Exam  History of Present Illness:   Jennifer Andrews is a 41 y.o. [phone] female being seen today for a routine annual exam.   Current complaints: Left groin/hip discomfort started yesterday. Feels like its a muscle cramp but limiting her movement. Has no recollection of an injury  No LMP recorded.  Current birth control: tubal ligation.   Last pap: 07/2017. Results were: NILM w/ HRHPV negative. H/O abnormal pap: no Last MXR: None on file  Health Maintenance Due  Topic Date Due   COVID-19 Vaccine (1) Never done   TETANUS/TDAP  Never done   PAP SMEAR-Modifier  07/18/2020   INFLUENZA VACCINE  Never done    Review of Systems:   Pertinent items are noted in HPI Denies any headaches, blurred vision, fatigue, shortness of breath, chest pain, abdominal pain, abnormal vaginal discharge/itching/odor/irritation, problems with periods, bowel movements, urination, or intercourse unless otherwise stated above.  Pertinent History Reviewed:  Reviewed past medical,surgical, social and family history.  Reviewed problem list, medications and allergies. Physical Assessment:   Vitals:   01/25/22 1028  BP: (!) 148/93  Pulse: 84  Weight: 280 lb (127 kg)  Height: 5' (1.524 m)  Body mass index is 54.68 kg/m.   Physical Examination:  General appearance - well appearing, and in no distress Mental status - alert, oriented to person, place, and time Psych:  She has a normal mood and affect Skin - warm and dry, normal color, no suspicious lesions noted Chest - effort normal Heart - normal rate  Breasts - breasts appear normal, no suspicious masses, no skin or nipple changes or axillary nodes Abdomen - soft, nontender, nondistended, no masses or organomegaly Pelvic -  VULVA: normal appearing vulva with no masses, tenderness or lesions  VAGINA: normal appearing vagina with normal color  and discharge, no lesions  CERVIX: normal appearing cervix without discharge or lesions, no CMT UTERUS: uterus is felt to be normal size, shape, consistency and nontender  ADNEXA: No adnexal masses or tenderness noted. Extremities:  No swelling or varicosities noted  Chaperone present for exam  No results found for this or any previous visit (from the past 24 hour(s)).  Assessment & Plan:  Mikaila was seen today for annual exam.  Diagnoses and all orders for this visit:  Encounter for annual routine gynecological examination - Cervical cancer screening: Discussed guidelines. Pap with HPV done - STD Testing: accepts - Birth Control: Tubal ligation - Breast Health: Encouraged self breast awareness/SBE. Teaching provided. Discussed limits of clinical breast exam for detecting breast cancer. Rx given for MXR - F/U 12 months and prn -     Cytology - PAP( [place]) -     MM Digital Screening; Future  Muscle spasm -     cyclobenzaprine (FLEXERIL) 5 MG tablet; Take 1 tablet (5 mg total) by mouth 3 (three) times daily as needed for muscle spasms. -     Ambulatory referral to Family Practice  Hypertension, unspecified type -     hydrochlorothiazide (HYDRODIURIL) 25 MG tablet; Take 1 tablet (25 mg total) by mouth daily.      Orders Placed This Encounter  Procedures   MM Digital Screening   Ambulatory referral to Family Practice    Meds:  Meds ordered this encounter  Medications   cyclobenzaprine (FLEXERIL) 5 MG tablet    Sig: Take 1 tablet (5 mg total) by  mouth 3 (three) times daily as needed for muscle spasms.    Dispense:  20 tablet    Refill:  0   hydrochlorothiazide (HYDRODIURIL) 25 MG tablet    Sig: Take 1 tablet (25 mg total) by mouth daily.    Dispense:  30 tablet    Refill:  3    Follow-up: Return in about 1 year (around 01/26/2023) for annual.  Milas Hock, MD 01/25/2022 10:51 AM

## 2022-01-25 ENCOUNTER — Other Ambulatory Visit (HOSPITAL_COMMUNITY)
Admission: RE | Admit: 2022-01-25 | Discharge: 2022-01-25 | Disposition: A | Discharge status: Home / Self Care | Payer: Medicaid Other | Source: Ambulatory Visit | Attending: Obstetrics and Gynecology | Admitting: Obstetrics and Gynecology

## 2022-01-25 ENCOUNTER — Other Ambulatory Visit (HOSPITAL_BASED_OUTPATIENT_CLINIC_OR_DEPARTMENT_OTHER): Payer: Self-pay

## 2022-01-25 ENCOUNTER — Encounter: Payer: Self-pay | Admitting: Obstetrics and Gynecology

## 2022-01-25 ENCOUNTER — Ambulatory Visit: Payer: Medicaid Other | Admitting: Obstetrics and Gynecology

## 2022-01-25 VITALS — BP 148/93 | HR 84 | Ht 60.0 in | Wt 280.0 lb

## 2022-01-25 DIAGNOSIS — I10 Essential (primary) hypertension: Secondary | ICD-10-CM | POA: Diagnosis not present

## 2022-01-25 DIAGNOSIS — Z01419 Encounter for gynecological examination (general) (routine) without abnormal findings: Secondary | ICD-10-CM

## 2022-01-25 DIAGNOSIS — M62838 Other muscle spasm: Secondary | ICD-10-CM

## 2022-01-25 MED ORDER — CYCLOBENZAPRINE HCL 5 MG PO TABS
5.0000 mg | ORAL_TABLET | Freq: Three times a day (TID) | ORAL | 0 refills | Status: DC | PRN
  Filled 2022-01-25: qty 20, 7d supply, fill #0

## 2022-01-25 MED ORDER — HYDROCHLOROTHIAZIDE 25 MG PO TABS
25.0000 mg | ORAL_TABLET | Freq: Every day | ORAL | 3 refills | Status: DC
  Filled 2022-01-25: qty 30, 30d supply, fill #0

## 2022-01-31 LAB — CYTOLOGY - PAP
Comment: NEGATIVE
Diagnosis: NEGATIVE
High risk HPV: NEGATIVE

## 2022-02-15 ENCOUNTER — Ambulatory Visit: Payer: Medicaid Other

## 2022-02-23 ENCOUNTER — Ambulatory Visit: Payer: Medicaid Other | Admitting: Family Medicine

## 2022-03-22 ENCOUNTER — Ambulatory Visit (INDEPENDENT_AMBULATORY_CARE_PROVIDER_SITE_OTHER): Payer: Medicaid Other | Admitting: Family Medicine

## 2022-03-22 DIAGNOSIS — Z91199 Patient's noncompliance with other medical treatment and regimen due to unspecified reason: Secondary | ICD-10-CM

## 2022-03-22 NOTE — Progress Notes (Signed)
   New Patient Office Visit  No Show  Jeryn Cerney S Halil Rentz, DO   

## 2022-07-31 ENCOUNTER — Ambulatory Visit: Payer: Medicaid Other | Admitting: Family Medicine

## 2022-08-13 NOTE — Progress Notes (Signed)
Erroneous encounter-disregard

## 2022-08-20 ENCOUNTER — Encounter: Payer: Medicaid Other | Admitting: Family

## 2022-08-20 DIAGNOSIS — Z7689 Persons encountering health services in other specified circumstances: Secondary | ICD-10-CM

## 2022-11-07 ENCOUNTER — Ambulatory Visit: Payer: BC Managed Care – PPO | Admitting: Family Medicine

## 2022-12-06 ENCOUNTER — Encounter (HOSPITAL_BASED_OUTPATIENT_CLINIC_OR_DEPARTMENT_OTHER): Payer: Self-pay

## 2022-12-06 ENCOUNTER — Emergency Department (HOSPITAL_BASED_OUTPATIENT_CLINIC_OR_DEPARTMENT_OTHER): Payer: BC Managed Care – PPO

## 2022-12-06 ENCOUNTER — Other Ambulatory Visit: Payer: Self-pay

## 2022-12-06 ENCOUNTER — Emergency Department (HOSPITAL_BASED_OUTPATIENT_CLINIC_OR_DEPARTMENT_OTHER)
Admission: EM | Admit: 2022-12-06 | Discharge: 2022-12-06 | Disposition: A | Discharge status: Home / Self Care | Payer: BC Managed Care – PPO | Attending: Emergency Medicine | Admitting: Emergency Medicine

## 2022-12-06 DIAGNOSIS — Z79899 Other long term (current) drug therapy: Secondary | ICD-10-CM | POA: Insufficient documentation

## 2022-12-06 DIAGNOSIS — M1711 Unilateral primary osteoarthritis, right knee: Secondary | ICD-10-CM | POA: Insufficient documentation

## 2022-12-06 DIAGNOSIS — M25561 Pain in right knee: Secondary | ICD-10-CM | POA: Diagnosis present

## 2022-12-06 DIAGNOSIS — M1712 Unilateral primary osteoarthritis, left knee: Secondary | ICD-10-CM | POA: Diagnosis not present

## 2022-12-06 DIAGNOSIS — M25461 Effusion, right knee: Secondary | ICD-10-CM

## 2022-12-06 DIAGNOSIS — M170 Bilateral primary osteoarthritis of knee: Secondary | ICD-10-CM

## 2022-12-06 MED ORDER — OXYCODONE HCL 5 MG PO TABS
10.0000 mg | ORAL_TABLET | Freq: Once | ORAL | Status: AC
  Administered 2022-12-06: 10 mg via ORAL
  Filled 2022-12-06: qty 2

## 2022-12-06 MED ORDER — OXYCODONE HCL 5 MG PO TABS: 5.0000 mg | ORAL_TABLET | Freq: Four times a day (QID) | ORAL | 0 refills | Status: AC | PRN

## 2022-12-06 MED ORDER — ACETAMINOPHEN 500 MG PO TABS
1000.0000 mg | ORAL_TABLET | Freq: Once | ORAL | Status: AC
  Administered 2022-12-06: 1000 mg via ORAL
  Filled 2022-12-06: qty 2

## 2022-12-06 MED ORDER — DEXAMETHASONE 4 MG PO TABS
6.0000 mg | ORAL_TABLET | Freq: Once | ORAL | Status: AC
  Administered 2022-12-06: 6 mg via ORAL
  Filled 2022-12-06: qty 2

## 2022-12-06 MED ORDER — IBUPROFEN 400 MG PO TABS
600.0000 mg | ORAL_TABLET | Freq: Once | ORAL | Status: AC
  Administered 2022-12-06: 600 mg via ORAL
  Filled 2022-12-06: qty 1

## 2022-12-06 NOTE — ED Triage Notes (Signed)
Pt arrives with c/o bilateral knee pain that started about a month ago. Pt denies injury.

## 2022-12-06 NOTE — Discharge Instructions (Addendum)
Your x-rays showed some arthritic changes and a small amount of fluid on the right but no other issues with the bones.   I recommend following up closely with orthopedics which I have provided. For your pain at home, use Tylenol, ibuprofen, and Voltaren gel as first line. You may use Voltaren gel topically for mild pain. For moderate to severe pain, use Tylenol 1000mg  and/or ibuprofen 600mg  every 8 hours. I recommend taking these with food. You may take both of these medications at the same time. For continued, severe pain, use narcotic pain medication as prescribed but try to avoid this if possible.  It is important to remain active to prevent worsening arthritis. I recommend starting with gentle stretching and exercise when you get your pain under better control to help your knee problems long-term. I provided information on RICE therapy while you continue to rehabilitate your knees.   I also recommend establishing care with a PCP. I provided 2 clinics or you may go to a PCP of your own choosing.   For new or worsening symptoms including uncontrolled pain, fever, severe swelling, chest pain, shortness of breath, or other new, concerning symptoms, return to the nearest ED for reevaluation.

## 2022-12-06 NOTE — ED Provider Notes (Signed)
[place] [facility] Provider Note   CSN: [id number] Arrival date & time: 12/06/22  2124     History  Chief Complaint  Patient presents with   Knee Pain    Jennifer Andrews is a 42 y.o. female who presents to the ED complaining of bilateral knee pain for the last month.  States that it has become increasingly difficult to ambulate secondary to the pain.  She has been using Tiger balm topical at home without relief of symptoms.  Notes that she works at [employer] and is on her feet frequently as well as lives in the upstairs apartment and at first thought symptoms were worsened secondary to this.  She was seen in urgent care in [place] for the same closer to the onset of symptoms and prescribed 100 mg ibuprofen but did not have any improvement with this.  She denies any fall or direct injury to the knees.  She denies fever, chills, nausea, vomiting, other body aches, chest pain, shortness of breath, or other complaints today.      Home Medications Prior to Admission medications   Medication Sig Start Date End Date Taking? Authorizing Provider  oxyCODONE (ROXICODONE) 5 MG immediate release tablet Take 1 tablet (5 mg total) by mouth every 6 (six) hours as needed for up to 5 days for severe pain or breakthrough pain. 12/06/22 12/11/22 Yes Tyreanna Bisesi L, PA-C  cyclobenzaprine (FLEXERIL) 5 MG tablet Take 1 tablet (5 mg total) by mouth 3 (three) times daily as needed for muscle spasms. 01/25/22   Milas Hock, MD  hydrochlorothiazide (HYDRODIURIL) 25 MG tablet Take 1 tablet (25 mg total) by mouth daily. 01/25/22   Milas Hock, MD      Allergies    Patient has no known allergies.    Review of Systems   Review of Systems  All other systems reviewed and are negative.   Physical Exam Updated Vital Signs BP (!) 158/101   Pulse 96   Temp 98.1 F (36.7 C) (Oral)   Resp 18   Wt 127 kg   SpO2 95%   BMI 54.68 kg/m  Physical Exam Vitals and  nursing note reviewed.  Constitutional:      General: She is not in acute distress.    Appearance: Normal appearance.  HENT:     Head: Normocephalic and atraumatic.     Mouth/Throat:     Mouth: Mucous membranes are moist.  Eyes:     Conjunctiva/sclera: Conjunctivae normal.  Cardiovascular:     Rate and Rhythm: Normal rate and regular rhythm.     Heart sounds: No murmur heard. Pulmonary:     Effort: Pulmonary effort is normal.     Breath sounds: Normal breath sounds.  Abdominal:     General: Abdomen is flat.     Palpations: Abdomen is soft.  Musculoskeletal:     Cervical back: Neck supple.     Comments: Bilateral knee joint stable to palpation, no obvious deformity, no overlying erythema, increased warmth, diffusely tender, no point area of tenderness identified, right anterior knee has a small area of swelling to the superior aspect, no calf tenderness bilaterally, negative Homans' sign, 2+ DP and PT pulses  Skin:    General: Skin is warm and dry.     Capillary Refill: Capillary refill takes less than 2 seconds.  Neurological:     Mental Status: She is alert. Mental status is at baseline.  Psychiatric:  Behavior: Behavior normal.     ED Results / Procedures / Treatments   Labs (all labs ordered are listed, but only abnormal results are displayed) Labs Reviewed - No data to display  EKG None  Radiology DG Knee Complete 4 Views Left  Result Date: 12/06/2022 CLINICAL DATA:  Bilateral knee pain. EXAM: LEFT KNEE - COMPLETE 4+ VIEW; RIGHT KNEE - COMPLETE 4+ VIEW COMPARISON:  None Available. FINDINGS: Four views of the right knee: There is a small suprapatellar bursal effusion. The superficial soft tissues are unremarkable. There is mild narrowing and spurring involving the medial femorotibial joint and patellofemoral joint. There is no evidence of erosive arthropathy, fracture or dislocation. No focal pathologic bone lesion. Four views of the left knee: There is no evidence  of fracture, dislocation or joint effusion. The superficial soft tissues are unremarkable. There is slight narrowing and trace spurring involving the medial femorotibial joint and the patellofemoral joint. There is no evidence of erosive arthropathy or other focal bone abnormality. IMPRESSION: There are mild degenerative changes in both knees. There is a small right suprapatellar bursal effusion. No evidence of fracture or dislocation. Electronically Signed   By: Almira Bar M.D.   On: 12/06/2022 22:15   DG Knee Complete 4 Views Right  Result Date: 12/06/2022 CLINICAL DATA:  Bilateral knee pain. EXAM: LEFT KNEE - COMPLETE 4+ VIEW; RIGHT KNEE - COMPLETE 4+ VIEW COMPARISON:  None Available. FINDINGS: Four views of the right knee: There is a small suprapatellar bursal effusion. The superficial soft tissues are unremarkable. There is mild narrowing and spurring involving the medial femorotibial joint and patellofemoral joint. There is no evidence of erosive arthropathy, fracture or dislocation. No focal pathologic bone lesion. Four views of the left knee: There is no evidence of fracture, dislocation or joint effusion. The superficial soft tissues are unremarkable. There is slight narrowing and trace spurring involving the medial femorotibial joint and the patellofemoral joint. There is no evidence of erosive arthropathy or other focal bone abnormality. IMPRESSION: There are mild degenerative changes in both knees. There is a small right suprapatellar bursal effusion. No evidence of fracture or dislocation. Electronically Signed   By: Almira Bar M.D.   On: 12/06/2022 22:15    Procedures Procedures    Medications Ordered in ED Medications  oxyCODONE (Oxy IR/ROXICODONE) immediate release tablet 10 mg (10 mg Oral Given 12/06/22 2312)  acetaminophen (TYLENOL) tablet 1,000 mg (1,000 mg Oral Given 12/06/22 2312)  ibuprofen (ADVIL) tablet 600 mg (600 mg Oral Given 12/06/22 2312)  dexamethasone (DECADRON)  tablet 6 mg (6 mg Oral Given 12/06/22 2312)    ED Course/ Medical Decision Making/ A&P                             Medical Decision Making Amount and/or Complexity of Data Reviewed Radiology: ordered. Decision-making details documented in ED Course.  Risk OTC drugs. Prescription drug management.   Medical Decision Making:   Jennifer Andrews is a 42 y.o. female who presented to the ED today with knee pain detailed above.     Complete initial physical exam performed, notably the patient was in NAD. Bilateral knee joint stable to palpation, no obvious deformity, no overlying erythema, increased warmth, diffusely tender, no point area of tenderness identified, right anterior knee has a small area of swelling to the superior aspect, no calf tenderness bilaterally, negative Homans' sign, 2+ DP and PT pulses.    Reviewed and confirmed  nursing documentation for past medical history, family history, social history.    Initial Assessment:   With the patient's presentation, differential diagnosis includes but is not limited to sprain, strain, contusion, fracture, dislocation, septic joint, osteoarthritis.   This is most consistent with an acute complicated illness  Initial Plan:  XR to evaluate for bony pathology Symptomatic management Objective evaluation as below reviewed   Initial Study Results:   Radiology:  All images reviewed independently. Agree with radiology report at this time.   DG Knee Complete 4 Views Left  Result Date: 12/06/2022 CLINICAL DATA:  Bilateral knee pain. EXAM: LEFT KNEE - COMPLETE 4+ VIEW; RIGHT KNEE - COMPLETE 4+ VIEW COMPARISON:  None Available. FINDINGS: Four views of the right knee: There is a small suprapatellar bursal effusion. The superficial soft tissues are unremarkable. There is mild narrowing and spurring involving the medial femorotibial joint and patellofemoral joint. There is no evidence of erosive arthropathy, fracture or dislocation. No focal  pathologic bone lesion. Four views of the left knee: There is no evidence of fracture, dislocation or joint effusion. The superficial soft tissues are unremarkable. There is slight narrowing and trace spurring involving the medial femorotibial joint and the patellofemoral joint. There is no evidence of erosive arthropathy or other focal bone abnormality. IMPRESSION: There are mild degenerative changes in both knees. There is a small right suprapatellar bursal effusion. No evidence of fracture or dislocation. Electronically Signed   By: Almira Bar M.D.   On: 12/06/2022 22:15   DG Knee Complete 4 Views Right  Result Date: 12/06/2022 CLINICAL DATA:  Bilateral knee pain. EXAM: LEFT KNEE - COMPLETE 4+ VIEW; RIGHT KNEE - COMPLETE 4+ VIEW COMPARISON:  None Available. FINDINGS: Four views of the right knee: There is a small suprapatellar bursal effusion. The superficial soft tissues are unremarkable. There is mild narrowing and spurring involving the medial femorotibial joint and patellofemoral joint. There is no evidence of erosive arthropathy, fracture or dislocation. No focal pathologic bone lesion. Four views of the left knee: There is no evidence of fracture, dislocation or joint effusion. The superficial soft tissues are unremarkable. There is slight narrowing and trace spurring involving the medial femorotibial joint and the patellofemoral joint. There is no evidence of erosive arthropathy or other focal bone abnormality. IMPRESSION: There are mild degenerative changes in both knees. There is a small right suprapatellar bursal effusion. No evidence of fracture or dislocation. Electronically Signed   By: Almira Bar M.D.   On: 12/06/2022 22:15      Final Assessment and Plan:   42 year old female presents to the ED complaining of bilateral knee pain.  No fall or injury.  No fever.  No overlying erythema or increased warmth.  Patient has diffuse tenderness over both knee joints but they are stable and she  is neurovascularly intact distally.  Small area of effusion to the superior aspect of the right.  Do not suspect septic joint.  Negative Homans' sign.  No calf tenderness.  Do not suspect DVT.  Patient is hypertensive but otherwise vital signs reassuring.  She is nontoxic-appearing.  Without injury, do not suspect emergent acute process.  X-ray does show findings of osteoarthritis with an effusion on the right.  Suspect that patient's symptoms are acute on chronic.  Discussed with patient lifestyle modifications to help with pain.  PDMP reviewed and negative for repeat or hide prescriptions.  She has trialed ibuprofen before without significant relief.  With this, we will treat patient at home with first-line  Tylenol, ibuprofen or topical Voltaren, and narcotic pain medication as absolutely needed for breakthrough, severe pain.  Patient expressed understanding of this plan.  Stressed the importance of close PCP and orthopedics follow-up and patient agreeable to do so.  Strict ED return precautions given, all questions answered, and stable for discharge.   Clinical Impression:  1. Acute pain of both knees   2. Primary osteoarthritis of both knees   3. Effusion of right knee      Discharge           Final Clinical Impression(s) / ED Diagnoses Final diagnoses:  Acute pain of both knees  Primary osteoarthritis of both knees  Effusion of right knee    Rx / DC Orders ED Discharge Orders          Ordered    oxyCODONE (ROXICODONE) 5 MG immediate release tablet  Every 6 hours PRN        12/06/22 2321              Tonette Lederer, PA-C 12/06/22 2327    Virgina Norfolk, DO 12/07/22 1639

## 2023-01-18 ENCOUNTER — Telehealth: Payer: Self-pay | Admitting: *Deleted

## 2023-01-18 NOTE — Telephone Encounter (Signed)
Left patient a message to call and schedule annual. 

## 2023-02-19 ENCOUNTER — Ambulatory Visit: Payer: BC Managed Care – PPO | Admitting: Obstetrics and Gynecology

## 2023-04-03 ENCOUNTER — Telehealth: Payer: Self-pay | Admitting: *Deleted

## 2023-04-03 NOTE — Telephone Encounter (Signed)
Left patient a message to call and schedule annual for Dec.

## 2023-06-11 ENCOUNTER — Emergency Department (HOSPITAL_BASED_OUTPATIENT_CLINIC_OR_DEPARTMENT_OTHER): Payer: Medicaid Other

## 2023-06-11 ENCOUNTER — Encounter (HOSPITAL_BASED_OUTPATIENT_CLINIC_OR_DEPARTMENT_OTHER): Payer: Self-pay | Admitting: Emergency Medicine

## 2023-06-11 ENCOUNTER — Other Ambulatory Visit: Payer: Self-pay

## 2023-06-11 ENCOUNTER — Emergency Department (HOSPITAL_BASED_OUTPATIENT_CLINIC_OR_DEPARTMENT_OTHER)
Admission: EM | Admit: 2023-06-11 | Discharge: 2023-06-11 | Disposition: A | Discharge status: Home / Self Care | Payer: Medicaid Other | Attending: Emergency Medicine | Admitting: Emergency Medicine

## 2023-06-11 ENCOUNTER — Other Ambulatory Visit (HOSPITAL_BASED_OUTPATIENT_CLINIC_OR_DEPARTMENT_OTHER): Payer: Self-pay

## 2023-06-11 DIAGNOSIS — M25569 Pain in unspecified knee: Secondary | ICD-10-CM | POA: Diagnosis present

## 2023-06-11 DIAGNOSIS — M79661 Pain in right lower leg: Secondary | ICD-10-CM | POA: Diagnosis not present

## 2023-06-11 DIAGNOSIS — Z6841 Body Mass Index (BMI) 40.0 and over, adult: Secondary | ICD-10-CM | POA: Diagnosis not present

## 2023-06-11 DIAGNOSIS — I509 Heart failure, unspecified: Secondary | ICD-10-CM | POA: Insufficient documentation

## 2023-06-11 DIAGNOSIS — M1711 Unilateral primary osteoarthritis, right knee: Secondary | ICD-10-CM | POA: Insufficient documentation

## 2023-06-11 DIAGNOSIS — E669 Obesity, unspecified: Secondary | ICD-10-CM | POA: Insufficient documentation

## 2023-06-11 DIAGNOSIS — Z79899 Other long term (current) drug therapy: Secondary | ICD-10-CM | POA: Insufficient documentation

## 2023-06-11 DIAGNOSIS — M7989 Other specified soft tissue disorders: Secondary | ICD-10-CM | POA: Diagnosis not present

## 2023-06-11 DIAGNOSIS — M25561 Pain in right knee: Secondary | ICD-10-CM | POA: Diagnosis not present

## 2023-06-11 MED ORDER — PREDNISONE 50 MG PO TABS
50.0000 mg | ORAL_TABLET | Freq: Every day | ORAL | 0 refills | Status: DC
  Filled 2023-06-11: qty 5, 5d supply, fill #0

## 2023-06-11 MED ORDER — DEXAMETHASONE SODIUM PHOSPHATE 10 MG/ML IJ SOLN
10.0000 mg | Freq: Once | INTRAMUSCULAR | Status: AC
  Administered 2023-06-11: 10 mg via INTRAMUSCULAR
  Filled 2023-06-11: qty 1

## 2023-06-11 MED ORDER — HYDROCODONE-ACETAMINOPHEN 5-325 MG PO TABS
1.0000 | ORAL_TABLET | ORAL | 0 refills | Status: DC | PRN
  Filled 2023-06-11: qty 10, 2d supply, fill #0

## 2023-06-11 MED ORDER — IBUPROFEN 800 MG PO TABS
800.0000 mg | ORAL_TABLET | Freq: Three times a day (TID) | ORAL | 0 refills | Status: DC
  Filled 2023-06-11: qty 21, 7d supply, fill #0

## 2023-06-11 MED ORDER — KETOROLAC TROMETHAMINE 30 MG/ML IJ SOLN
30.0000 mg | Freq: Once | INTRAMUSCULAR | Status: AC
  Administered 2023-06-11: 30 mg via INTRAMUSCULAR
  Filled 2023-06-11: qty 1

## 2023-06-11 NOTE — ED Provider Notes (Signed)
[place] [facility] Provider Note   CSN: [id number] Arrival date & time: 06/11/23  1053     History  Chief Complaint  Patient presents with   Knee Pain    Jennifer Andrews is a 43 y.o. female.  Pt is a 43 yo female with pmhx significant for arthritis, chf, obesity and migraines.  Pt has been having trouble with pain in her knees for several months.  She was seen here in July and dx'd with OA.  She was given a rx for prednisone which did help the pain.  She lost her insurance, so could not follow up with ortho.  She has medicaid now, so is interested in a referral.  Pt said her right knee has been swollen and has been hurting more for the past few days.  She said she's been having a hard time at work because she has to walk a lot.       Home Medications Prior to Admission medications   Medication Sig Start Date End Date Taking? Authorizing Provider  HYDROcodone-acetaminophen (NORCO/VICODIN) 5-325 MG tablet Take 1 tablet by mouth every 4 (four) hours as needed. 06/11/23  Yes Jacalyn Lefevre, MD  ibuprofen (ADVIL) 800 MG tablet Take 1 tablet (800 mg total) by mouth 3 (three) times daily. 06/11/23  Yes Jacalyn Lefevre, MD  predniSONE (DELTASONE) 50 MG tablet Take 1 tablet (50 mg total) by mouth daily with breakfast. 06/11/23  Yes Jacalyn Lefevre, MD  cyclobenzaprine (FLEXERIL) 5 MG tablet Take 1 tablet (5 mg total) by mouth 3 (three) times daily as needed for muscle spasms. 01/25/22   Milas Hock, MD  hydrochlorothiazide (HYDRODIURIL) 25 MG tablet Take 1 tablet (25 mg total) by mouth daily. 01/25/22   Milas Hock, MD      Allergies    Patient has no known allergies.    Review of Systems   Review of Systems  Musculoskeletal:        R knee pain  All other systems reviewed and are negative.   Physical Exam Updated Vital Signs BP (!) 149/88 (BP Location: Left Arm)   Pulse 78   Temp 98.4 F (36.9 C) (Oral)   Resp 18   Ht 5' (1.524 m)   Wt  127 kg   LMP 05/28/2023   SpO2 99%   BMI 54.68 kg/m  Physical Exam Vitals and nursing note reviewed.  Constitutional:      Appearance: Normal appearance. She is obese.  HENT:     Head: Normocephalic and atraumatic.     Right Ear: External ear normal.     Left Ear: External ear normal.     Nose: Nose normal.     Mouth/Throat:     Mouth: Mucous membranes are moist.     Pharynx: Oropharynx is clear.  Eyes:     Extraocular Movements: Extraocular movements intact.     Conjunctiva/sclera: Conjunctivae normal.     Pupils: Pupils are equal, round, and reactive to light.  Cardiovascular:     Rate and Rhythm: Normal rate and regular rhythm.     Pulses: Normal pulses.     Heart sounds: Normal heart sounds.  Pulmonary:     Effort: Pulmonary effort is normal.     Breath sounds: Normal breath sounds.  Abdominal:     General: Abdomen is flat. Bowel sounds are normal.     Palpations: Abdomen is soft.  Musculoskeletal:     Cervical back: Normal range of motion and neck  supple.     Right knee: Swelling and bony tenderness present.  Skin:    General: Skin is warm.     Capillary Refill: Capillary refill takes less than 2 seconds.  Neurological:     General: No focal deficit present.     Mental Status: She is alert and oriented to person, place, and time.  Psychiatric:        Mood and Affect: Mood normal.        Behavior: Behavior normal.     ED Results / Procedures / Treatments   Labs (all labs ordered are listed, but only abnormal results are displayed) Labs Reviewed - No data to display  EKG None  Radiology US Venous Img Lower Unilateral Right Result Date: 06/11/2023 CLINICAL DATA:  Posterior calf tenderness.  Pain. EXAM: RIGHT LOWER EXTREMITY VENOUS DOPPLER ULTRASOUND TECHNIQUE: Gray-scale sonography with compression, as well as color and duplex ultrasound, were performed to evaluate the deep venous system(s) from the level of the common femoral vein through the popliteal and  proximal calf veins. COMPARISON:  None Available. FINDINGS: VENOUS Normal compressibility of the common femoral, superficial femoral, and popliteal veins, as well as the visualized calf veins. Visualized portions of profunda femoral vein and great saphenous vein unremarkable. No filling defects to suggest DVT on grayscale or color Doppler imaging. Doppler waveforms show normal direction of venous flow, normal respiratory plasticity and response to augmentation. Limited views of the contralateral common femoral vein are unremarkable. OTHER None. Limitations: none IMPRESSION: No evidence of right lower extremity DVT. Electronically Signed   By: Narda Rutherford M.D.   On: 06/11/2023 15:04   DG Knee Complete 4 Views Right Result Date: 06/11/2023 CLINICAL DATA:  Right knee pain and swelling. EXAM: RIGHT KNEE - COMPLETE 4+ VIEW COMPARISON:  12/06/2022 FINDINGS: No fracture or dislocation. Slight medial tibiofemoral joint space narrowing. Minor peripheral spurring, similar to prior. Subchondral cyst in the central patella. No significant knee joint effusion. No erosion or focal bone abnormality. No focal soft tissue abnormalities. IMPRESSION: Mild osteoarthritis, similar to prior. No acute osseous abnormality. Electronically Signed   By: Narda Rutherford M.D.   On: 06/11/2023 15:03    Procedures Procedures    Medications Ordered in ED Medications  dexamethasone (DECADRON) injection 10 mg (10 mg Intramuscular Given 06/11/23 1554)  ketorolac (TORADOL) 30 MG/ML injection 30 mg (30 mg Intramuscular Given 06/11/23 1554)    ED Course/ Medical Decision Making/ A&P                                 Medical Decision Making Amount and/or Complexity of Data Reviewed Radiology: ordered.  Risk Prescription drug management.   This patient presents to the ED for concern of knee pain, this involves an extensive number of treatment options, and is a complaint that carries with it a high risk of complications and  morbidity.  The differential diagnosis includes dvt, arthritis, fx   Co morbidities that complicate the patient evaluation  arthritis, chf, obesity and migraines   Additional history obtained:  Additional history obtained from epic chart review  Imaging Studies ordered:  I ordered imaging studies including [place], xr  I independently visualized and interpreted imaging which showed  [place]: No evidence of right lower extremity DVT.  R knee: Mild osteoarthritis, similar to prior. No acute osseous abnormality.  I agree with the radiologist interpretation  Medicines ordered and prescription drug management:  I ordered medication  including decadron and toradol  for sx  Reevaluation of the patient after these medicines showed that the patient improved I have reviewed the patients home medicines and have made adjustments as needed   Problem List / ED Course:  R knee pain:  likely due to oa and obesity.  Pt is d/c with rx for prednisone and lortab.  She is to f/u with sports medicine.  She knows to return if worse.    Social Determinants of Health:  Lives at home   Dispostion:  After consideration of the diagnostic results and the patients response to treatment, I feel that the patent would benefit from discharge with outpatient f/u.          Final Clinical Impression(s) / ED Diagnoses Final diagnoses:  Osteoarthritis of right knee, unspecified osteoarthritis type    Rx / DC Orders ED Discharge Orders          Ordered    predniSONE (DELTASONE) 50 MG tablet  Daily with breakfast        06/11/23 1544    HYDROcodone-acetaminophen (NORCO/VICODIN) 5-325 MG tablet  Every 4 hours PRN        06/11/23 1544    ibuprofen (ADVIL) 800 MG tablet  3 times daily        06/11/23 1544              Jacalyn Lefevre, MD 06/11/23 1657

## 2023-06-11 NOTE — ED Provider Triage Note (Signed)
Emergency Medicine Provider Triage Evaluation Note  Jennifer Andrews , a 43 y.o. female  was evaluated in triage.  Pt complains of right knee pain/swelling and posterior calf pain. No known trauma,falls  Was seen on 12/06/23 for bilateral knee pain and dx with OA with small effusion. Discharged with pain management  Review of Systems  Positive: Knee pain, posterior calf pain Negative: fevers  Physical Exam  BP (!) 149/88 (BP Location: Left Arm)   Pulse 78   Temp 98.4 F (36.9 C) (Oral)   Resp 18   Ht 5' (1.524 m)   Wt 127 kg   SpO2 99%   BMI 54.68 kg/m  Gen:   Awake, no distress   Resp:  Normal effort  MSK:   Moves extremities without difficulty  Other:  TTP of anterior knee , TTP right posterior upper calf   Difficulty with MSK exam d/t habitus  Medical Decision Making  Medically screening exam initiated at 1:28 PM.  Appropriate orders placed.  Jennifer Andrews was informed that the remainder of the evaluation will be completed by another provider, this initial triage assessment does not replace that evaluation, and the importance of remaining in the ED until their evaluation is complete.  DVT US and XR ordered   Judithann Sheen, PA 06/11/23 1332

## 2023-06-11 NOTE — ED Triage Notes (Signed)
C?o R knee pain. Denies injuries. States was seen for same 2 months ago and dx w/ arthritis. States pain has not got better.

## 2023-06-18 ENCOUNTER — Other Ambulatory Visit (HOSPITAL_BASED_OUTPATIENT_CLINIC_OR_DEPARTMENT_OTHER): Payer: Self-pay

## 2023-06-18 ENCOUNTER — Encounter: Payer: Self-pay | Admitting: Family Medicine

## 2023-06-18 ENCOUNTER — Ambulatory Visit: Payer: Medicaid Other | Admitting: Family Medicine

## 2023-06-18 VITALS — BP 146/90 | Ht 60.0 in | Wt 279.0 lb

## 2023-06-18 DIAGNOSIS — M79605 Pain in left leg: Secondary | ICD-10-CM | POA: Diagnosis not present

## 2023-06-18 DIAGNOSIS — M25561 Pain in right knee: Secondary | ICD-10-CM

## 2023-06-18 DIAGNOSIS — M25562 Pain in left knee: Secondary | ICD-10-CM

## 2023-06-18 DIAGNOSIS — G8929 Other chronic pain: Secondary | ICD-10-CM | POA: Diagnosis not present

## 2023-06-18 DIAGNOSIS — R030 Elevated blood-pressure reading, without diagnosis of hypertension: Secondary | ICD-10-CM

## 2023-06-18 MED ORDER — METHOCARBAMOL 500 MG PO TABS
500.0000 mg | ORAL_TABLET | Freq: Three times a day (TID) | ORAL | 0 refills | Status: AC | PRN
  Filled 2023-06-18: qty 21, 7d supply, fill #0

## 2023-06-18 MED ORDER — DICLOFENAC SODIUM 75 MG PO TBEC
75.0000 mg | DELAYED_RELEASE_TABLET | Freq: Two times a day (BID) | ORAL | 0 refills | Status: DC
  Filled 2023-06-18: qty 60, 30d supply, fill #0

## 2023-06-18 NOTE — Progress Notes (Signed)
 CHIEF COMPLAINT: No chief complaint on file.  _____________________________________________________________ SUBJECTIVE  HPI  Pt is a 43 y.o. female here for evaluation of Right knee pain  History of arthritis Presented to the emergency room 06/11/2023, note reviewed: -Seen for pain in her knees for several months, diagnosed with arthritis 11/2022.  Given prednisone  that helped.  Had shared that she lost insurance and was unable to follow-up with orthopedics at that time -At ED visit reported right knee swelling and increasing pain, difficulty at work due to walking -Venous duplex negative, x-ray also ordered -Prescribed 50 mg prednisone , 800 mg ibuprofen  3 times daily, Vicodin, referred to sports medicine  B/l Knee concerns ongoing since July 2024  Severity of pain is increasing, quality of pain has been the same Inciting event: none known, is wondering if it has been due to her weight Primarily located: anterior knee pain, within the knees, feels it when she bends Radiating: can radiate down to the bottom of her foot (L) side Numbness/tingling: back of her knee Catching/locking: reports yes Exacerbated by  Sitting for long periods of time, bending her knee (feels tightness), when standing, walking  Reporting significant tightness and symptoms of the left calf now, instead of the right calf   Therapies tried so far:  Voltaren  and topicals are the only things that have helped for 5-10 minutes  Prednisone  worsened her swelling  Vicodin, ibuprofen  (800mg  did not help her pain) Works as: industrial/product designer at [employer]  ------------------------------------------------------------------------------------------------------ Past Medical History:  Diagnosis Date   CHF (congestive heart failure) (HCC) 2008   Patient states that this was a postpartum diagnosis and she was cleared from this and her cardiomegaly in 2009   Migraines     Past Surgical History:  Procedure Laterality Date    APPENDECTOMY     CESAREAN SECTION     TUBAL LIGATION Bilateral       Outpatient Encounter Medications as of 06/18/2023  Medication Sig Note   diclofenac  (VOLTAREN ) 75 MG EC tablet Take 1 tablet (75 mg total) by mouth 2 (two) times daily.    methocarbamol  (ROBAXIN ) 500 MG tablet Take 1 tablet (500 mg total) by mouth every 8 (eight) hours as needed for up to 7 days for muscle spasms.    hydrochlorothiazide  (HYDRODIURIL ) 25 MG tablet Take 1 tablet (25 mg total) by mouth daily.    HYDROcodone -acetaminophen  (NORCO/VICODIN) 5-325 MG tablet Take 1 tablet by mouth every 4 (four) hours as needed.    predniSONE  (DELTASONE ) 50 MG tablet Take 1 tablet (50 mg total) by mouth daily with breakfast.    [DISCONTINUED] cyclobenzaprine  (FLEXERIL ) 5 MG tablet Take 1 tablet (5 mg total) by mouth 3 (three) times daily as needed for muscle spasms. 06/18/2023: discontinuing   [DISCONTINUED] ibuprofen  (ADVIL ) 800 MG tablet Take 1 tablet (800 mg total) by mouth 3 (three) times daily. 06/18/2023: discontinuing   No facility-administered encounter medications on file as of 06/18/2023.    ------------------------------------------------------------------------------------------------------  _____________________________________________________________ OBJECTIVE  PHYSICAL EXAM  Today's Vitals   06/18/23 0951  BP: (!) 146/90  Weight: 279 lb (126.6 kg)  Height: 5' (1.524 m)   Body mass index is 54.49 kg/m.   reviewed  General: A+Ox3, no acute distress, well-nourished, appropriate affect CV: pulses 2+ regular, nondiaphoretic, no peripheral edema, cap refill <2sec Lungs: no audible wheezing, non-labored breathing, bilateral chest rise/fall, nontachypneic Skin: warm, well-perfused, non-icteric, no susp lesions or rashes Neuro:  Sensation intact, muscle tone wnl, no atrophy Psych: no signs of depression or anxiety MSK:  R Knee: No swelling or deformity Neg fluid wave, joint milking ROM Flex 100, Ext 0 TTP b/l jt  line, patellar facets NTTP over the quad tendon, patella tendon, tibial tuberostiy, fibular head, posterior fossa, pes anserine bursa, gerdy's tubercle Neg anterior and posterior drawer Neg lachman Neg sag sign Negative varus stress Negative valgus stress Negative McMurray  Right knee x-ray independently reviewed, revealing reduced medial joint spacing, small superior patellar enthesopathy, trace spurring of the medial femoral-tibial joint, reduced patellofemoral joint space  _____________________________________________________________ ASSESSMENT/PLAN Diagnoses and all orders for this visit:  Bilateral chronic knee pain -     Ambulatory referral to Physical Therapy  Chronic pain of right knee  Elevated blood pressure reading  Left leg pain -     US  Venous Img Lower Unilateral Left (DVT); Future  Other orders -     diclofenac  (VOLTAREN ) 75 MG EC tablet; Take 1 tablet (75 mg total) by mouth 2 (two) times daily. -     methocarbamol  (ROBAXIN ) 500 MG tablet; Take 1 tablet (500 mg total) by mouth every 8 (eight) hours as needed for up to 7 days for muscle spasms.   Exam and XR findings c/w patellofemoral and OA etiology, discussed with patient. Offered formal PT, which was ordered today. Calf tightness more severe on the left than right, latter of which was negative; will obtain LLE duplex to rule out alternate pathology. Discussed options for pain management, trial PO voltaren  and robaxin . Encourage icing/heat therapy, topical otcs also reviewed. Plan to follow-up in 1-1.5 weeks, sooner as needed. All questions answered. Return precautions discussed. Patient verbalized understanding and is in agreement with plan  Electronically signed by: Benedict LELON Bumps, MD 06/18/2023 1:41 PM

## 2023-06-27 ENCOUNTER — Ambulatory Visit: Payer: Medicaid Other | Admitting: Family Medicine

## 2023-06-27 NOTE — Progress Notes (Deleted)
 CHIEF COMPLAINT: No chief complaint on file.  _____________________________________________________________ SUBJECTIVE  HPI  Pt is a 43 y.o. female here for Follow-up of right knee pain  Seen last week 06/18/23 -Trial Voltaren and Robaxin;_ -Physical therapy_ -Duplex not yet completed ---------------------------------------------------------------  Ongoing for *** Inciting event: Primarily located   Radiating Numbness/tingling Catching/locking *** Exacerbated by Therapies tried so far:  Works as Psychiatric nurse***   ------------------------------------------------------------------------------------------------------ Past Medical History:  Diagnosis Date   CHF (congestive heart failure) (HCC) 2008   Patient states that this was a postpartum diagnosis and she was cleared from this and her cardiomegaly in 2009   Migraines     Past Surgical History:  Procedure Laterality Date   APPENDECTOMY     CESAREAN SECTION     TUBAL LIGATION Bilateral       Outpatient Encounter Medications as of 06/27/2023  Medication Sig   diclofenac (VOLTAREN) 75 MG EC tablet Take 1 tablet (75 mg total) by mouth 2 (two) times daily.   hydrochlorothiazide (HYDRODIURIL) 25 MG tablet Take 1 tablet (25 mg total) by mouth daily.   HYDROcodone-acetaminophen (NORCO/VICODIN) 5-325 MG tablet Take 1 tablet by mouth every 4 (four) hours as needed.   predniSONE (DELTASONE) 50 MG tablet Take 1 tablet (50 mg total) by mouth daily with breakfast.   No facility-administered encounter medications on file as of 06/27/2023.    ------------------------------------------------------------------------------------------------------  _____________________________________________________________ OBJECTIVE  PHYSICAL EXAM  There were no vitals filed for this visit. There is no height or weight on file to calculate BMI.   reviewed  General: A+Ox3, no acute distress, well-nourished, appropriate affect CV: pulses 2+  regular, nondiaphoretic, no peripheral edema, cap refill <2sec Lungs: no audible wheezing, non-labored breathing, bilateral chest rise/fall, nontachypneic Skin: warm, well-perfused, non-icteric, no susp lesions or rashes Neuro:  Sensation intact, muscle tone wnl, no atrophy Psych: no signs of depression or anxiety MSK: *** Knee: No swelling or deformity Neg fluid wave, joint milking ROM Flex {Numbers; 0-100:15068}, Ext {NUMBERS; -10-45 JOINT ROM:10287} NTTP over the quad tendon, medial fem condyle, lat fem condyle, patella, plica, patella tendon, tibial tuberostiy, fibular head, posterior fossa, pes anserine bursa, gerdy's tubercle, medial jt line, lateral jt line Neg anterior and posterior drawer Neg lachman Neg sag sign Negative varus stress Negative valgus stress Negative McMurray Negative Thessaly  Gait {GAIT:23234}  _____________________________________________________________ ASSESSMENT/PLAN There are no diagnoses linked to this encounter.      Electronically signed by: Burna Forts, MD 06/27/2023 7:24 AM

## 2023-06-29 NOTE — Progress Notes (Unsigned)
   ANNUAL EXAM Patient name: Jennifer Andrews MRN [id number]  Date of birth: January 18, 1981 Chief Complaint:   No chief complaint on file.  History of Present Illness:   Jennifer Andrews is a 43 y.o. [phone] female being seen today for a routine annual exam.   Current complaints: ***  Patient's last menstrual period was 05/28/2023.  Current birth control: tubal ligation.   Last pap: 03/2022. Results were: NILM w/ HRHPV negative. H/O abnormal pap: no Last MXR: None on file    Review of Systems:   Pertinent items are noted in HPI Denies any headaches, blurred vision, fatigue, shortness of breath, chest pain, abdominal pain, abnormal vaginal discharge/itching/odor/irritation, problems with periods, bowel movements, urination, or intercourse unless otherwise stated above.  Pertinent History Reviewed:  Reviewed past medical,surgical, social and family history.  Reviewed problem list, medications and allergies. Physical Assessment:   There were no vitals filed for this visit. There is no height or weight on file to calculate BMI.   Physical Examination:  General appearance - well appearing, and in no distress Mental status - alert, oriented to person, place, and time Psych:  She has a normal mood and affect Skin - warm and dry, normal color, no suspicious lesions noted Chest - effort normal Heart - normal rate  Breasts - breasts appear normal, no suspicious masses, no skin or nipple changes or axillary nodes Abdomen - soft, nontender, nondistended, no masses or organomegaly Pelvic -  VULVA: normal appearing vulva with no masses, tenderness or lesions  VAGINA: normal appearing vagina with normal color and discharge, no lesions  CERVIX: normal appearing cervix without discharge or lesions, no CMT UTERUS: uterus is felt to be normal size, shape, consistency and nontender  ADNEXA: No adnexal masses or tenderness noted. Extremities:  No swelling or varicosities noted  Chaperone present  for exam  No results found for this or any previous visit (from the past 24 hours).  Assessment & Plan:  [name] was seen today for annual exam.  Diagnoses and all orders for this visit:  Encounter for annual routine gynecological examination - Cervical cancer screening: Discussed guidelines. Pap with HPV wnl 03/2022 - STD Testing: accepts - Birth Control: Tubal ligation - Breast Health: Encouraged self breast awareness/SBE. Teaching provided. Discussed limits of clinical breast exam for detecting breast cancer. Rx given for MXR - F/U 12 months and prn -     Cytology - PAP( [facility]) -     MM Digital Screening; Future       No orders of the defined types were placed in this encounter.   Meds:  No orders of the defined types were placed in this encounter.   Follow-up: No follow-ups on file.  Milas Hock, MD 06/29/2023 11:22 AM

## 2023-07-01 ENCOUNTER — Other Ambulatory Visit (HOSPITAL_COMMUNITY)
Admission: RE | Admit: 2023-07-01 | Discharge: 2023-07-01 | Disposition: A | Discharge status: Home / Self Care | Payer: Medicaid Other | Source: Ambulatory Visit | Attending: Obstetrics and Gynecology | Admitting: Obstetrics and Gynecology

## 2023-07-01 ENCOUNTER — Ambulatory Visit: Payer: Medicaid Other | Admitting: Obstetrics and Gynecology

## 2023-07-01 ENCOUNTER — Ambulatory Visit (HOSPITAL_BASED_OUTPATIENT_CLINIC_OR_DEPARTMENT_OTHER): Admission: RE | Admit: 2023-07-01 | Payer: Medicaid Other | Source: Ambulatory Visit

## 2023-07-01 ENCOUNTER — Ambulatory Visit: Payer: Medicaid Other | Admitting: Family Medicine

## 2023-07-01 ENCOUNTER — Other Ambulatory Visit: Payer: Self-pay

## 2023-07-01 ENCOUNTER — Encounter: Payer: Self-pay | Admitting: Obstetrics and Gynecology

## 2023-07-01 ENCOUNTER — Encounter: Payer: Self-pay | Admitting: Family Medicine

## 2023-07-01 VITALS — BP 118/84 | Ht 60.0 in | Wt 291.0 lb

## 2023-07-01 VITALS — BP 120/81 | HR 89 | Ht 60.0 in | Wt 291.0 lb

## 2023-07-01 DIAGNOSIS — M25561 Pain in right knee: Secondary | ICD-10-CM

## 2023-07-01 DIAGNOSIS — G8929 Other chronic pain: Secondary | ICD-10-CM

## 2023-07-01 DIAGNOSIS — Z01419 Encounter for gynecological examination (general) (routine) without abnormal findings: Secondary | ICD-10-CM | POA: Insufficient documentation

## 2023-07-01 NOTE — Progress Notes (Unsigned)
 CHIEF COMPLAINT: No chief complaint on file.  _____________________________________________________________ SUBJECTIVE  HPI  Pt is a 43 y.o. female here for Follow-up of right knee pain  Seen 06/18/23 -Trial Voltaren and Robaxin;_ -Physical therapy_ -Duplex not yet completed for the left lower extremity  Cherry topical, warm compresses, bought a knee massager Not using voltaren bc not covered Compression  --------------------------------------------------------------- B/l Knee concerns ongoing since July 2024             Severity of pain is increasing, quality of pain has been the same Inciting event: none known, is wondering if it has been due to her weight Primarily located: anterior knee pain, within the knees, feels it when she bends Radiating: can radiate down to the bottom of her foot (L) side Numbness/tingling: back of her knee Catching/locking: reports yes Exacerbated by             Sitting for long periods of time, bending her knee (feels tightness), when standing, walking  Reporting significant tightness and symptoms of the left calf now, instead of the right calf     Ongoing for *** Inciting event: Primarily located   Radiating Numbness/tingling Catching/locking *** Exacerbated by Therapies tried so far:  Works as  ------------------------------------------------------------------------------------------------------ Past Medical History:  Diagnosis Date   CHF (congestive heart failure) (HCC) 2008   Patient states that this was a postpartum diagnosis and she was cleared from this and her cardiomegaly in 2009   Migraines     Past Surgical History:  Procedure Laterality Date   APPENDECTOMY     CESAREAN SECTION     TUBAL LIGATION Bilateral       Outpatient Encounter Medications as of 07/01/2023  Medication Sig   diclofenac (VOLTAREN) 75 MG EC tablet Take 1 tablet (75 mg total) by mouth 2 (two) times daily.   hydrochlorothiazide (HYDRODIURIL) 25 MG  tablet Take 1 tablet (25 mg total) by mouth daily.   HYDROcodone-acetaminophen (NORCO/VICODIN) 5-325 MG tablet Take 1 tablet by mouth every 4 (four) hours as needed.   predniSONE (DELTASONE) 50 MG tablet Take 1 tablet (50 mg total) by mouth daily with breakfast.   No facility-administered encounter medications on file as of 07/01/2023.    ------------------------------------------------------------------------------------------------------  _____________________________________________________________ OBJECTIVE  PHYSICAL EXAM  There were no vitals filed for this visit. There is no height or weight on file to calculate BMI.   reviewed  General: A+Ox3, no acute distress, well-nourished, appropriate affect CV: pulses 2+ regular, nondiaphoretic, no peripheral edema, cap refill <2sec Lungs: no audible wheezing, non-labored breathing, bilateral chest rise/fall, nontachypneic Skin: warm, well-perfused, non-icteric, no susp lesions or rashes Neuro:  Sensation intact, muscle tone wnl, no atrophy Psych: no signs of depression or anxiety MSK: *** Knee: No swelling or deformity Neg fluid wave, joint milking ROM Flex {Numbers; 0-100:15068}, Ext {NUMBERS; -10-45 JOINT ROM:10287} NTTP over the quad tendon, medial fem condyle, lat fem condyle, patella, plica, patella tendon, tibial tuberostiy, fibular head, posterior fossa, pes anserine bursa, gerdy's tubercle, medial jt line, lateral jt line Neg anterior and posterior drawer Neg lachman Neg sag sign Negative varus stress Negative valgus stress Negative McMurray Negative Thessaly  Gait {GAIT:23234}  _____________________________________________________________ ASSESSMENT/PLAN There are no diagnoses linked to this encounter.      Electronically signed by: Burna Forts, MD 07/01/2023 7:36 AM

## 2023-07-02 LAB — CERVICOVAGINAL ANCILLARY ONLY
Chlamydia: NEGATIVE
Comment: NEGATIVE
Comment: NEGATIVE
Comment: NORMAL
Neisseria Gonorrhea: NEGATIVE
Trichomonas: NEGATIVE

## 2023-07-02 LAB — HIV ANTIBODY (ROUTINE TESTING W REFLEX): HIV Screen 4th Generation wRfx: NONREACTIVE

## 2023-07-02 LAB — RPR: RPR Ser Ql: NONREACTIVE

## 2023-07-02 LAB — HEPATITIS B SURFACE ANTIGEN: Hepatitis B Surface Ag: NEGATIVE

## 2023-07-02 LAB — HEPATITIS C ANTIBODY: Hep C Virus Ab: NONREACTIVE

## 2023-07-03 ENCOUNTER — Encounter (HOSPITAL_BASED_OUTPATIENT_CLINIC_OR_DEPARTMENT_OTHER): Payer: Self-pay

## 2023-07-05 ENCOUNTER — Ambulatory Visit: Payer: Medicaid Other | Admitting: Family Medicine

## 2023-07-05 NOTE — Progress Notes (Deleted)
 CHIEF COMPLAINT: No chief complaint on file.  _____________________________________________________________ SUBJECTIVE  HPI  Pt is a 43 y.o. female here for follow-up, L knee US guided CSI  Underwent R knee CSI a few days ago with good benefit, interested in L knee injection.  Still has not gotten duplex study; chart review shows they have called several times and she has not made appt to schedule   ------------------------------------------------------------------------------------------------------ Past Medical History:  Diagnosis Date   CHF (congestive heart failure) (HCC) 2008   Patient states that this was a postpartum diagnosis and she was cleared from this and her cardiomegaly in 2009   Migraines     Past Surgical History:  Procedure Laterality Date   APPENDECTOMY     CESAREAN SECTION     TUBAL LIGATION Bilateral       Outpatient Encounter Medications as of 07/05/2023  Medication Sig   diclofenac (VOLTAREN) 75 MG EC tablet Take 1 tablet (75 mg total) by mouth 2 (two) times daily.   No facility-administered encounter medications on file as of 07/05/2023.    ------------------------------------------------------------------------------------------------------  _____________________________________________________________ OBJECTIVE  PHYSICAL EXAM  There were no vitals filed for this visit. There is no height or weight on file to calculate BMI.   reviewed  General: A+Ox3, no acute distress, well-nourished, appropriate affect CV: pulses 2+ regular, nondiaphoretic, no peripheral edema, cap refill <2sec Lungs: no audible wheezing, non-labored breathing, bilateral chest rise/fall, nontachypneic Skin: warm, well-perfused, non-icteric, no susp lesions or rashes Neuro: *** Sensation intact, muscle tone wnl, no atrophy Psych: no signs of depression or anxiety MSK: ***      _____________________________________________________________ ASSESSMENT/PLAN There are no  diagnoses linked to this encounter.  PARQ discussed, informed written consent obtained/timeout performed. Patient lying supine on exam table. Suprapatellar space visualized with ultrasound and effusion *** noted. Needle track area was prepped with alcohol and anesthetized with 3mL 1% lidocaine w/o epi. 18G needle with syringe was advanced via anterolateral approach into the suprapatellar intraarticular space with needle visualized on [place]. 5cc clear yellow aspirate obtained. Syringes were then exchanged and 4mL 1% lidocaine with 1mL 40mg /mL kenalog was injected into the intraarticular space. Patient tolerated procedure well without immediate complications. Post-procedural care instructions provided, return/ED precautions also shared. Patient verbalized understanding and in agreement with plan     Electronically signed by: Burna Forts, MD 07/05/2023 7:50 AM

## 2023-07-10 ENCOUNTER — Ambulatory Visit: Payer: Medicaid Other | Admitting: Family Medicine

## 2023-07-10 VITALS — BP 109/66 | HR 73 | Temp 97.9°F | Resp 18 | Ht 60.0 in | Wt 290.0 lb

## 2023-07-10 DIAGNOSIS — R600 Localized edema: Secondary | ICD-10-CM

## 2023-07-10 DIAGNOSIS — Z7689 Persons encountering health services in other specified circumstances: Secondary | ICD-10-CM

## 2023-07-10 DIAGNOSIS — Z131 Encounter for screening for diabetes mellitus: Secondary | ICD-10-CM | POA: Diagnosis not present

## 2023-07-10 NOTE — Progress Notes (Signed)
 New Patient Office Visit  Subjective    Patient ID: Jennifer Andrews, female    DOB: [birth year]  Age: 43 y.o. MRN: [id number]  CC:  Chief Complaint  Patient presents with  . Establish Care    Patient is here to establish care with new PCP, Patient states that she would like to discuss the swelling in her legs and both feet, she also experiences sharp pain in feet while walking    HPI Jennifer Andrews presents to establish care. Pt is new to me.  Pt has hx of knee pain. She is seeing Ortho. She has had fluid drawn out of her right knee 2 weeks ago. She also had steroid injection of the right knee. She also reports issues with her left knee in the past. She reports she has had leg swelling bilaterally since July 2024. She reports family hx of varicose veins. She admits to not drinking water. She reports drinking sodas, sweet tea, and very little water. She does report dry mouth and frequent urination in the last few months. Pt reports hx of preeclampsia after the birth of one of her children. She has 3 daughters, 7, 68, and 17 y.o. She reports seeing a cardiologist but all testing was normal.  Outpatient Encounter Medications as of 07/10/2023  Medication Sig  . [DISCONTINUED] diclofenac (VOLTAREN) 75 MG EC tablet Take 1 tablet (75 mg total) by mouth 2 (two) times daily. (Patient not taking: Reported on 07/10/2023)   No facility-administered encounter medications on file as of 07/10/2023.    Past Medical History:  Diagnosis Date  . Allergy   . Anemia   . CHF (congestive heart failure) (HCC) 2008   Patient states that this was a postpartum diagnosis and she was cleared from this and her cardiomegaly in 2009  . Hypertension 2023   Minor  . Migraines     Past Surgical History:  Procedure Laterality Date  . APPENDECTOMY    . CESAREAN SECTION    . TUBAL LIGATION Bilateral     Family History  Problem Relation Age of Onset  . Asthma Mother   . Diabetes Mother   . Hypertension  Mother   . Miscarriages / [place] Mother   . Stroke Father   . Diabetes Father   . Heart failure Father   . Alcohol abuse Father   . Drug abuse Father   . Hypertension Father   . Kidney disease Father   . Asthma Daughter   . Obesity Son   . Obesity Daughter     Social History   Socioeconomic History  . Marital status: Single    Spouse name: Not on file  . Number of children: Not on file  . Years of education: Not on file  . Highest education level: Some college, no degree  Occupational History  . Not on file  Tobacco Use  . Smoking status: Former    Current packs/day: 0.00    Average packs/day: 0.3 packs/day for 2.0 years (0.5 ttl pk-yrs)    Types: Cigarettes    Quit date: 05/14/2018    Years since quitting: 5.1    Passive exposure: Past  . Smokeless tobacco: Never  Vaping Use  . Vaping status: Never Used  Substance and Sexual Activity  . Alcohol use: No  . Drug use: No  . Sexual activity: Not Currently    Birth control/protection: Surgical  Other Topics Concern  . Not on file  Social History Narrative  . Not on  file   Social Drivers of Health   Financial Resource Strain: High Risk (07/10/2023)   Overall Financial Resource Strain (CARDIA)   . Difficulty of Paying Living Expenses: Very hard  Food Insecurity: Food Insecurity Present (07/10/2023)   Hunger Vital Sign   . Worried About Programme researcher, broadcasting/film/video in the Last Year: Often true   . Ran Out of Food in the Last Year: Sometimes true  Transportation Needs: No Transportation Needs (07/10/2023)   PRAPARE - Transportation   . Lack of Transportation (Medical): No   . Lack of Transportation (Non-Medical): No  Physical Activity: Insufficiently Active (07/10/2023)   Exercise Vital Sign   . Days of Exercise per Week: 5 days   . Minutes of Exercise per Session: 10 min  Stress: Stress Concern Present (07/10/2023)   [employer] of Occupational Health - Occupational Stress Questionnaire   . Feeling of Stress : Very  much  Social Connections: Moderately Isolated (07/10/2023)   Social Connection and Isolation Panel [NHANES]   . Frequency of Communication with Friends and Family: More than three times a week   . Frequency of Social Gatherings with Friends and Family: Patient declined   . Attends Religious Services: 1 to 4 times per year   . Active Member of Clubs or Organizations: No   . Attends Banker Meetings: Not on file   . Marital Status: Never married  Intimate Partner Violence: Not on file    Review of Systems  Cardiovascular:  Positive for leg swelling.  Endo/Heme/Allergies:        Dry  mouth, polyuria, polydipsia  All other systems reviewed and are negative.      Objective    BP 109/66   Pulse 73   Temp 97.9 F (36.6 C) (Oral)   Resp 18   Ht 5' (1.524 m)   Wt 290 lb (131.5 kg)   LMP 05/27/2023   SpO2 99%   BMI 56.64 kg/m   Physical Exam Vitals and nursing note reviewed.  Constitutional:      Appearance: Normal appearance. She is obese.  HENT:     Head: Normocephalic and atraumatic.     Right Ear: External ear normal.     Left Ear: External ear normal.     Nose: Nose normal.     Mouth/Throat:     Mouth: Mucous membranes are moist.     Pharynx: Oropharynx is clear.  Eyes:     Conjunctiva/sclera: Conjunctivae normal.     Pupils: Pupils are equal, round, and reactive to light.  Cardiovascular:     Rate and Rhythm: Normal rate and regular rhythm.     Pulses: Normal pulses.     Heart sounds: Normal heart sounds.  Pulmonary:     Effort: Pulmonary effort is normal.     Breath sounds: Normal breath sounds.  Musculoskeletal:        General: Swelling present.  Skin:    General: Skin is warm.     Capillary Refill: Capillary refill takes less than 2 seconds.  Neurological:     General: No focal deficit present.     Mental Status: She is alert and oriented to person, place, and time. Mental status is at baseline.  Psychiatric:        Mood and Affect: Mood  normal.        Behavior: Behavior normal.        Thought Content: Thought content normal.        Judgment: Judgment normal.  Assessment & Plan:   Problem List Items Addressed This Visit   None Encounter to establish care with new doctor  Peripheral edema -     TSH -     T4, free -     Brain natriuretic peptide -     Basic metabolic panel  Screening for diabetes mellitus -     Hemoglobin A1c   Pt with peripheral edema that is likely multifactorial. Could be diet, not drinking enough water daily leading to dehydration. Will screen thyroid, BNP, and kidney function today along with diabetes screen due to dry mouth, polyuria, and polydipsia.  No follow-ups on file.   Suzan Slick, MD

## 2023-07-12 ENCOUNTER — Encounter: Payer: Self-pay | Admitting: Family Medicine

## 2023-07-12 LAB — TSH: TSH: 1.22 u[IU]/mL (ref 0.450–4.500)

## 2023-07-12 LAB — BASIC METABOLIC PANEL
BUN/Creatinine Ratio: 10 (ref 9–23)
BUN: 9 mg/dL (ref 6–24)
CO2: 23 mmol/L (ref 20–29)
Calcium: 9 mg/dL (ref 8.7–10.2)
Chloride: 104 mmol/L (ref 96–106)
Creatinine, Ser: 0.91 mg/dL (ref 0.57–1.00)
Glucose: 93 mg/dL (ref 70–99)
Potassium: 4.8 mmol/L (ref 3.5–5.2)
Sodium: 141 mmol/L (ref 134–144)
eGFR: 81 mL/min/{1.73_m2} (ref >59)

## 2023-07-12 LAB — T4, FREE: Free T4: 1.27 ng/dL (ref 0.82–1.77)

## 2023-07-12 LAB — HEMOGLOBIN A1C
Est. average glucose Bld gHb Est-mCnc: 128 mg/dL
Hgb A1c MFr Bld: 6.1 % — ABNORMAL HIGH (ref 4.8–5.6)

## 2023-07-12 LAB — BRAIN NATRIURETIC PEPTIDE: BNP: 10.7 pg/mL (ref 0.0–100.0)

## 2023-08-07 ENCOUNTER — Ambulatory Visit: Admitting: Physical Therapy

## 2023-08-21 ENCOUNTER — Ambulatory Visit

## 2023-08-28 ENCOUNTER — Ambulatory Visit: Payer: Medicaid Other

## 2023-09-19 ENCOUNTER — Encounter (HOSPITAL_COMMUNITY): Payer: Self-pay

## 2023-09-26 ENCOUNTER — Ambulatory Visit

## 2023-09-26 DIAGNOSIS — Z01419 Encounter for gynecological examination (general) (routine) without abnormal findings: Secondary | ICD-10-CM

## 2023-09-26 DIAGNOSIS — Z1231 Encounter for screening mammogram for malignant neoplasm of breast: Secondary | ICD-10-CM

## 2023-10-02 ENCOUNTER — Other Ambulatory Visit: Payer: Self-pay | Admitting: Obstetrics and Gynecology

## 2023-10-02 DIAGNOSIS — R928 Other abnormal and inconclusive findings on diagnostic imaging of breast: Secondary | ICD-10-CM

## 2023-10-16 ENCOUNTER — Encounter

## 2023-10-16 ENCOUNTER — Ambulatory Visit
Admission: RE | Admit: 2023-10-16 | Discharge: 2023-10-16 | Disposition: A | Discharge status: Home / Self Care | Source: Ambulatory Visit | Attending: Obstetrics and Gynecology

## 2023-10-16 ENCOUNTER — Other Ambulatory Visit

## 2023-10-16 ENCOUNTER — Ambulatory Visit
Admission: RE | Admit: 2023-10-16 | Discharge: 2023-10-16 | Disposition: A | Discharge status: Home / Self Care | Source: Ambulatory Visit | Attending: Obstetrics and Gynecology | Admitting: Obstetrics and Gynecology

## 2023-10-16 ENCOUNTER — Other Ambulatory Visit: Payer: Self-pay | Admitting: Obstetrics and Gynecology

## 2023-10-16 DIAGNOSIS — N6323 Unspecified lump in the left breast, lower outer quadrant: Secondary | ICD-10-CM | POA: Diagnosis not present

## 2023-10-16 DIAGNOSIS — N6324 Unspecified lump in the left breast, lower inner quadrant: Secondary | ICD-10-CM | POA: Diagnosis not present

## 2023-10-16 DIAGNOSIS — R928 Other abnormal and inconclusive findings on diagnostic imaging of breast: Secondary | ICD-10-CM

## 2023-10-16 DIAGNOSIS — N6325 Unspecified lump in the left breast, overlapping quadrants: Secondary | ICD-10-CM | POA: Diagnosis not present

## 2023-10-16 DIAGNOSIS — N6320 Unspecified lump in the left breast, unspecified quadrant: Secondary | ICD-10-CM

## 2023-10-18 ENCOUNTER — Other Ambulatory Visit

## 2023-10-21 ENCOUNTER — Other Ambulatory Visit

## 2023-10-22 ENCOUNTER — Other Ambulatory Visit

## 2023-11-07 DIAGNOSIS — J302 Other seasonal allergic rhinitis: Secondary | ICD-10-CM | POA: Diagnosis not present

## 2023-11-07 DIAGNOSIS — Z1239 Encounter for other screening for malignant neoplasm of breast: Secondary | ICD-10-CM | POA: Diagnosis not present

## 2023-11-07 DIAGNOSIS — Z13220 Encounter for screening for lipoid disorders: Secondary | ICD-10-CM | POA: Diagnosis not present

## 2023-11-07 DIAGNOSIS — Z131 Encounter for screening for diabetes mellitus: Secondary | ICD-10-CM | POA: Diagnosis not present

## 2023-11-07 DIAGNOSIS — Z0000 Encounter for general adult medical examination without abnormal findings: Secondary | ICD-10-CM | POA: Diagnosis not present

## 2023-11-07 DIAGNOSIS — E559 Vitamin D deficiency, unspecified: Secondary | ICD-10-CM | POA: Diagnosis not present

## 2023-11-19 ENCOUNTER — Other Ambulatory Visit

## 2023-11-22 ENCOUNTER — Ambulatory Visit
Admission: RE | Admit: 2023-11-22 | Discharge: 2023-11-22 | Disposition: A | Discharge status: Home / Self Care | Source: Ambulatory Visit | Attending: Obstetrics and Gynecology

## 2023-11-22 ENCOUNTER — Ambulatory Visit
Admission: RE | Admit: 2023-11-22 | Discharge: 2023-11-22 | Disposition: A | Discharge status: Home / Self Care | Source: Ambulatory Visit | Attending: Obstetrics and Gynecology | Admitting: Obstetrics and Gynecology

## 2023-11-22 DIAGNOSIS — N6320 Unspecified lump in the left breast, unspecified quadrant: Secondary | ICD-10-CM

## 2023-11-22 DIAGNOSIS — N6325 Unspecified lump in the left breast, overlapping quadrants: Secondary | ICD-10-CM | POA: Diagnosis not present

## 2023-11-22 DIAGNOSIS — D242 Benign neoplasm of left breast: Secondary | ICD-10-CM | POA: Diagnosis not present

## 2023-11-22 HISTORY — PX: BREAST BIOPSY: SHX20

## 2023-11-25 ENCOUNTER — Ambulatory Visit: Payer: Self-pay | Admitting: Obstetrics and Gynecology

## 2023-11-25 LAB — SURGICAL PATHOLOGY

## 2023-11-26 DIAGNOSIS — J302 Other seasonal allergic rhinitis: Secondary | ICD-10-CM | POA: Diagnosis not present

## 2023-11-26 DIAGNOSIS — E7849 Other hyperlipidemia: Secondary | ICD-10-CM | POA: Diagnosis not present

## 2024-01-07 ENCOUNTER — Encounter: Payer: Self-pay | Admitting: Family Medicine

## 2024-01-07 ENCOUNTER — Ambulatory Visit: Payer: Medicaid Other | Admitting: Family Medicine

## 2024-01-07 VITALS — BP 102/60 | HR 85 | Temp 98.0°F | Resp 18 | Ht 60.0 in | Wt 272.5 lb

## 2024-01-07 DIAGNOSIS — F339 Major depressive disorder, recurrent, unspecified: Secondary | ICD-10-CM

## 2024-01-07 DIAGNOSIS — R7303 Prediabetes: Secondary | ICD-10-CM

## 2024-01-07 NOTE — Progress Notes (Signed)
 Established Patient Office Visit  Subjective   Patient ID: Jennifer Andrews, female    DOB: 08/03/1980  Age: 43 y.o. MRN: [id number]  Chief Complaint  Patient presents with   Follow-up    Patient is here for a 6 month follow up   Depression    Patient would like to discuss dealing with depression for a long time and states that it is starting to get worse.      Depression         Depression Flowsheet Row Office Visit from 01/07/2024 in [facility]  PHQ-9 Total Score 14       01/07/2024   10:03 AM 07/10/2023   10:15 AM  GAD 7 : Generalized Anxiety Score  Nervous, Anxious, on Edge 2 1  Control/stop worrying 2 1  Worry too much - different things 2 1  Trouble relaxing 3 1  Restless 2 1  Easily annoyed or irritable 2 1  Afraid - awful might happen 0 0  Total GAD 7 Score 13 6  Anxiety Difficulty Very difficult Not difficult at all     Pt reports she felt like she always had it since she was younger. She never went to talk to someone. She's been covering it up but has noticed the older she is getting, it's harder to cover it. She is starting to have irritability and doesn't want to come out of her room. She says if her depression is bad, she'll call out. She says it demobilizes her. She says her daughter came into her room the other day and was crying. She told her mother she has to talk to someone. She denies homicidal or suicidal thoughts but has asked herself why is she here and what is her purpose. She is opposed in taking any medicines. She would like to try counseling.   Pt was seen for CPE last visit. Had elevated A1c and is here for follow up.  Review of Systems  Psychiatric/Behavioral:  Positive for depression.   All other systems reviewed and are negative.     Objective:     BP 102/60   Pulse 85   Temp 98 F (36.7 C) (Oral)   Resp 18   Ht 5' (1.524 m)   Wt 272 lb 8 oz (123.6 kg)   SpO2 98%   BMI 53.22 kg/m  BP Readings from  Last 3 Encounters:  01/07/24 102/60  07/10/23 109/66  07/01/23 118/84      Physical Exam Vitals and nursing note reviewed.  Constitutional:      Appearance: Normal appearance. She is obese.  HENT:     Head: Normocephalic and atraumatic.     Right Ear: External ear normal.     Left Ear: External ear normal.     Nose: Nose normal.     Mouth/Throat:     Mouth: Mucous membranes are moist.     Pharynx: Oropharynx is clear.  Eyes:     Conjunctiva/sclera: Conjunctivae normal.     Pupils: Pupils are equal, round, and reactive to light.  Cardiovascular:     Rate and Rhythm: Normal rate.  Pulmonary:     Effort: Pulmonary effort is normal.  Skin:    General: Skin is warm.     Capillary Refill: Capillary refill takes less than 2 seconds.  Neurological:     General: No focal deficit present.     Mental Status: She is alert and oriented to person, place, and  time. Mental status is at baseline.  Psychiatric:        Mood and Affect: Mood normal.        Behavior: Behavior normal.        Thought Content: Thought content normal.        Judgment: Judgment normal.      No results found for any visits on 01/07/24.  Last hemoglobin A1c Lab Results  Component Value Date   HGBA1C 6.1 (H) 07/10/2023      The ASCVD Risk score (Arnett DK, et al., 2019) failed to calculate for the following reasons:   Cannot find a previous HDL lab   Cannot find a previous total cholesterol lab    Assessment & Plan:   Problem List Items Addressed This Visit   None Visit Diagnoses       Depression, recurrent (HCC)    -  Primary   Relevant Orders   Ambulatory referral to Psychiatry     Prediabetes       Relevant Orders   Hemoglobin A1c      Depression, recurrent (HCC) -     Ambulatory referral to Psychiatry  Prediabetes -     Hemoglobin A1c   She would like to try counseling. Referral placed today. She is opposed to taking medicines at this time Recheck A1c today No follow-ups on file.     Torrence CINDERELLA Barrier, MD

## 2024-01-08 LAB — HEMOGLOBIN A1C
Est. average glucose Bld gHb Est-mCnc: 117 mg/dL
Hgb A1c MFr Bld: 5.7 % — ABNORMAL HIGH (ref 4.8–5.6)

## 2024-01-09 ENCOUNTER — Ambulatory Visit: Payer: Self-pay | Admitting: Family Medicine

## 2024-01-09 DIAGNOSIS — E7849 Other hyperlipidemia: Secondary | ICD-10-CM | POA: Diagnosis not present

## 2024-01-09 DIAGNOSIS — J302 Other seasonal allergic rhinitis: Secondary | ICD-10-CM | POA: Diagnosis not present

## 2024-01-29 ENCOUNTER — Encounter: Payer: Self-pay | Admitting: Nurse Practitioner

## 2024-01-30 ENCOUNTER — Telehealth

## 2024-01-30 DIAGNOSIS — T7840XA Allergy, unspecified, initial encounter: Secondary | ICD-10-CM

## 2024-01-30 MED ORDER — HYDROXYZINE PAMOATE 25 MG PO CAPS
25.0000 mg | ORAL_CAPSULE | Freq: Three times a day (TID) | ORAL | 0 refills | Status: AC | PRN
Start: 2024-01-30 — End: ?

## 2024-01-30 NOTE — Patient Instructions (Signed)
  Mylinda LITTIE Daring, thank you for joining Elsie Velma Lunger, PA-C for today's virtual visit.  While this provider is not your primary care provider (PCP), if your PCP is located in our provider database this encounter information will be shared with them immediately following your visit.   A [facility] MyChart account gives you access to today's visit and all your visits, tests, and labs performed at [facility]  click here if you don't have a [facility] MyChart account or go to [email or web address]  Consent: (Patient) Mylinda LITTIE Daring provided verbal consent for this virtual visit at the beginning of the encounter.  Current Medications:  Current Outpatient Medications:    hydrOXYzine  (VISTARIL ) 25 MG capsule, Take 1 capsule (25 mg total) by mouth every 8 (eight) hours as needed., Disp: 30 capsule, Rfl: 0   phentermine (ADIPEX-P) 37.5 MG tablet, Take 37.5 mg by mouth every morning., Disp: , Rfl:    Vitamin D, Ergocalciferol, (DRISDOL) 1.25 MG (50000 UNIT) CAPS capsule, Take 50,000 Units by mouth once a week., Disp: , Rfl:    Medications ordered in this encounter:  Meds ordered this encounter  Medications   hydrOXYzine  (VISTARIL ) 25 MG capsule    Sig: Take 1 capsule (25 mg total) by mouth every 8 (eight) hours as needed.    Dispense:  30 capsule    Refill:  0    Supervising Provider:   BLAISE ALEENE KIDD [[id number]]     *If you need refills on other medications prior to your next appointment, please contact your pharmacy*  Follow-Up: Call back or seek an in-person evaluation if the symptoms worsen or if the condition fails to improve as anticipated.  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 If they are requiring you to go back into that area, follow-up with your PCP for work accommodation and allergy testing. I have sent in hydroxyzine  to keep on hand, just in case of another episode despite being out  of that environment.  If you note any non-resolving, new, or worsening symptoms despite treatment, please seek an in-person evaluation ASAP.    If you have been instructed to have an in-person evaluation today at a local Urgent Care facility, please use the link below. It will take you to a list of all of our available [facility], including address, phone number and hours of operation. Please do not delay care.  [facility]  If you or a family member do not have a primary care provider, use the link below to schedule a visit and establish care. When you choose a [facility] primary care physician or advanced practice provider, you gain a long-term partner in health. Find a Primary Care Provider  Learn more about [facility] in-office and virtual care options: [facility] - Get Care Now

## 2024-01-30 NOTE — Progress Notes (Signed)
 Virtual Visit Consent   Jennifer Andrews, you are scheduled for a virtual visit with a [facility] provider today. Just as with appointments in the office, your consent must be obtained to participate. Your consent will be active for this visit and any virtual visit you may have with one of our providers in the next 365 days. If you have a MyChart account, a copy of this consent can be sent to you electronically.  As this is a virtual visit, video technology does not allow for your provider to perform a traditional examination. This may limit your provider's ability to fully assess your condition. If your provider identifies any concerns that need to be evaluated in person or the need to arrange testing (such as labs, EKG, etc.), we will make arrangements to do so. Although advances in technology are sophisticated, we cannot ensure that it will always work on either your end or our end. If the connection with a video visit is poor, the visit may have to be switched to a telephone visit. With either a video or telephone visit, we are not always able to ensure that we have a secure connection.  By engaging in this virtual visit, you consent to the provision of healthcare and authorize for your insurance to be billed (if applicable) for the services provided during this visit. Depending on your insurance coverage, you may receive a charge related to this service.  I need to obtain your verbal consent now. Are you willing to proceed with your visit today? Jennifer Andrews has provided verbal consent on 01/30/2024 for a virtual visit (video or telephone). Jennifer Andrews, [place]  Date: 01/30/2024 8:59 AM   Virtual Visit via Video Note   I, Jennifer Andrews, connected with  Jennifer Andrews  ([id number], 1981/01/22) on 01/30/24 at  9:00 AM EDT by a video-enabled telemedicine application and verified that I am speaking with the correct person using two identifiers.  Location: Patient: Virtual Visit  Location Patient: Home Provider: Virtual Visit Location Provider: Home Office   I discussed the limitations of evaluation and management by telemedicine and the availability of in person appointments. The patient expressed understanding and agreed to proceed.    History of Present Illness: Jennifer Andrews is a 43 y.o. who identifies as a female who was assigned female at birth, and is being seen today for concern of allergic reactions to an unknown trigger. 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 As such, was given two diff types of gloves -- Latex gloves (still occurring), then gave cotton with rubber fingertip gloves last week (still not helping). Notes no issue outside of that particular area at work. No symptoms occurring at home. Denies change to soaps, lotions, hygiene products, etc. Denies facial swelling or SOB when symptoms occur.  HPI: HPI  Problems:  Patient Active Problem List   Diagnosis Date Noted   Migraines     Allergies: No Known Allergies Medications:  Current Outpatient Medications:    phentermine (ADIPEX-P) 37.5 MG tablet, Take 37.5 mg by mouth every morning., Disp: , Rfl:    Vitamin D, Ergocalciferol, (DRISDOL) 1.25 MG (50000 UNIT) CAPS capsule, Take 50,000 Units by mouth once a week., Disp: , Rfl:   Observations/Objective: Patient is well-developed, well-nourished in no acute distress.  Resting comfortably at home.  Head is normocephalic, atraumatic.  No labored breathing.  Speech is clear and coherent with logical content.  Patient is alert and oriented at baseline.   Assessment and Plan: There are no diagnoses linked to this encounter. Asymptomatic at present. Unspecified workplace trigger. Does not happen when working in office (her typical location). Just in the warehouse. Will have her keep symptoms journal. Certainly  if occurring in new environment, she needs PCP assessment and referral to Allergist. She is no longer having to work in warehouse and no episodes when elsewhere. Will put hydroxyzine  on file at pharmacy to take in case of repeat episode, at which time she is also to seek in person assessment with PCP.  Follow Up Instructions: I discussed the assessment and treatment plan with the patient. The patient was provided an opportunity to ask questions and all were answered. The patient agreed with the plan and demonstrated an understanding of the instructions.  A copy of instructions were sent to the patient via MyChart unless otherwise noted below.   The patient was advised to call back or seek an in-person evaluation if the symptoms worsen or if the condition fails to improve as anticipated.    Jennifer Velma Lunger, PA-C

## 2024-02-09 ENCOUNTER — Ambulatory Visit
Admission: RE | Admit: 2024-02-09 | Discharge: 2024-02-09 | Disposition: A | Discharge status: Home / Self Care | Source: Ambulatory Visit | Attending: Family Medicine | Admitting: Family Medicine

## 2024-02-09 VITALS — BP 112/83 | HR 97 | Temp 98.6°F | Resp 18

## 2024-02-09 DIAGNOSIS — J029 Acute pharyngitis, unspecified: Secondary | ICD-10-CM | POA: Diagnosis not present

## 2024-02-09 LAB — POCT RAPID STREP A (OFFICE): Rapid Strep A Screen: NEGATIVE

## 2024-02-09 MED ORDER — DEXAMETHASONE 6 MG PO TABS
10.0000 mg | ORAL_TABLET | Freq: Once | ORAL | Status: AC
Start: 2024-02-09 — End: 2024-02-09
  Administered 2024-02-09: 10 mg via ORAL

## 2024-02-09 NOTE — Discharge Instructions (Signed)
 I gave you a dose of steroid (Decadron ) to help with the throat pain and swelling Make sure you are drinking lots of fluids May take Tylenol  or ibuprofen  as needed for pain Expect improvement over next few days

## 2024-02-09 NOTE — ED Triage Notes (Signed)
 Patient presents to Urgent Care with complaints of sore throat since 2-3 days ago. Patient reports having difficulty swallowing. Denies any fever or chills. Did do salt water gargles. Progressively worsening. She states her tonsils are swollen. Having increased mucus production, having to spit up in the morning.

## 2024-02-09 NOTE — ED Provider Notes (Signed)
 [facility]    CSN: [id number] Arrival date & time: 02/09/24  1020      History   Chief Complaint Chief Complaint  Patient presents with   Sore Throat    Entered by patient    HPI ZLATY ALEXA is a 43 y.o. female.   HPI  Patient's had a painful sore throat for 3 days.  She states that it hurts to swallow.  She has had no headache or bodyaches.  No fever or chills.  No cold symptoms runny nose or cough.  No known exposure to strep  Past Medical History:  Diagnosis Date   Allergy    Anemia    CHF (congestive heart failure) (HCC) 2008   Patient states that this was a postpartum diagnosis and she was cleared from this and her cardiomegaly in 2009   Hypertension 2023   Minor   Migraines     Patient Active Problem List   Diagnosis Date Noted   Migraines     Past Surgical History:  Procedure Laterality Date   APPENDECTOMY     BREAST BIOPSY Left 11/22/2023   US  LT BREAST BX W LOC DEV 1ST LESION IMG BX SPEC US  GUIDE 11/22/2023 [facility]   CESAREAN SECTION     TUBAL LIGATION Bilateral     OB History     Gravida  4   Para  4   Term  4   Preterm      AB      Living  4      SAB      IAB      Ectopic      Multiple      Live Births  4            Home Medications    Prior to Admission medications   Medication Sig Start Date End Date Taking? Authorizing Provider  phentermine (ADIPEX-P) 37.5 MG tablet Take 37.5 mg by mouth every morning. 12/11/23  Yes [provider]  Vitamin D, Ergocalciferol, (DRISDOL) 1.25 MG (50000 UNIT) CAPS capsule Take 50,000 Units by mouth once a week. 11/13/23  Yes [provider]  hydrOXYzine  (VISTARIL ) 25 MG capsule Take 1 capsule (25 mg total) by mouth every 8 (eight) hours as needed. 01/30/24   Gladis Elsie BROCKS, PA-C    Family History Family History  Problem Relation Age of Onset   Asthma Mother    Diabetes Mother    Hypertension Mother    Miscarriages / [place] Mother     Stroke Father    Diabetes Father    Heart failure Father    Alcohol abuse Father    Drug abuse Father    Hypertension Father    Kidney disease Father    Asthma Daughter    Obesity Son    Obesity Daughter     Social History Social History   Tobacco Use   Smoking status: Former    Current packs/day: 0.00    Average packs/day: 0.3 packs/day for 2.0 years (0.5 ttl pk-yrs)    Types: Cigarettes    Quit date: 05/14/2018    Years since quitting: 5.7    Passive exposure: Past   Smokeless tobacco: Never  Vaping Use   Vaping status: Never Used  Substance Use Topics   Alcohol use: No   Drug use: No     Allergies   Patient has no known allergies.   Review of Systems Review of Systems  See HPI Physical Exam  Triage Vital Signs ED Triage Vitals  Encounter Vitals Group     BP 02/09/24 1030 112/83     Girls Systolic BP Percentile --      Girls Diastolic BP Percentile --      Boys Systolic BP Percentile --      Boys Diastolic BP Percentile --      Pulse Rate 02/09/24 1030 97     Resp 02/09/24 1030 18     Temp 02/09/24 1030 98.6 F (37 C)     Temp Source 02/09/24 1030 Oral     SpO2 02/09/24 1030 97 %     Weight --      Height --      Head Circumference --      Peak Flow --      Pain Score 02/09/24 1028 6     Pain Loc --      Pain Education --      Exclude from Growth Chart --    No data found.  Updated Vital Signs BP 112/83 (BP Location: Right Arm)   Pulse 97   Temp 98.6 F (37 C) (Oral)   Resp 18   SpO2 97%       Physical Exam Constitutional:      General: She is not in acute distress.    Appearance: She is well-developed. She is obese. She is ill-appearing.  HENT:     Head: Normocephalic and atraumatic.     Right Ear: Tympanic membrane normal.     Left Ear: Tympanic membrane normal.     Nose: No congestion.     Mouth/Throat:     Mouth: Mucous membranes are moist.     Pharynx: Uvula midline. Pharyngeal swelling and posterior oropharyngeal erythema  present.     Tonsils: No tonsillar exudate. 2+ on the right. 2+ on the left.  Eyes:     Conjunctiva/sclera: Conjunctivae normal.     Pupils: Pupils are equal, round, and reactive to light.  Cardiovascular:     Rate and Rhythm: Normal rate.  Pulmonary:     Effort: Pulmonary effort is normal. No respiratory distress.  Abdominal:     General: There is no distension.     Palpations: Abdomen is soft.  Musculoskeletal:        General: Normal range of motion.     Cervical back: Normal range of motion.  Lymphadenopathy:     Cervical: Cervical adenopathy present.  Skin:    General: Skin is warm and dry.  Neurological:     Mental Status: She is alert.      UC Treatments / Results  Labs (all labs ordered are listed, but only abnormal results are displayed) Labs Reviewed  POCT RAPID STREP A (OFFICE) - Normal    EKG   Radiology No results found.  Procedures Procedures (including critical care time)  Medications Ordered in UC Medications  dexamethasone  (DECADRON ) tablet 10 mg (10 mg Oral Given 02/09/24 1058)    Initial Impression / Assessment and Plan / UC Course  I have reviewed the triage vital signs and the nursing notes.  Pertinent labs & imaging results that were available during my care of the patient were reviewed by me and considered in my medical decision making (see chart for details).     Tonsils are swollen.  No exudate.Explained that strep is negative.  Likely viral infection.  Patient is given a single dose of Decadron  to help with her throat pain and swelling. Final Clinical Impressions(s) /  UC Diagnoses   Final diagnoses:  Sore throat  Viral pharyngitis     Discharge Instructions      I gave you a dose of steroid (Decadron ) to help with the throat pain and swelling Make sure you are drinking lots of fluids May take Tylenol  or ibuprofen  as needed for pain Expect improvement over next few days   ED Prescriptions   None    PDMP not reviewed  this encounter.   Maranda Jamee Jacob, MD 02/09/24 [phone]

## 2024-02-14 DIAGNOSIS — L258 Unspecified contact dermatitis due to other agents: Secondary | ICD-10-CM | POA: Diagnosis not present

## 2024-02-14 DIAGNOSIS — E559 Vitamin D deficiency, unspecified: Secondary | ICD-10-CM | POA: Diagnosis not present

## 2024-02-14 DIAGNOSIS — Z6841 Body Mass Index (BMI) 40.0 and over, adult: Secondary | ICD-10-CM | POA: Diagnosis not present

## 2024-02-14 DIAGNOSIS — J302 Other seasonal allergic rhinitis: Secondary | ICD-10-CM | POA: Diagnosis not present

## 2024-02-14 DIAGNOSIS — Z7189 Other specified counseling: Secondary | ICD-10-CM | POA: Diagnosis not present

## 2024-03-17 DIAGNOSIS — L259 Unspecified contact dermatitis, unspecified cause: Secondary | ICD-10-CM | POA: Diagnosis not present

## 2024-03-17 DIAGNOSIS — J302 Other seasonal allergic rhinitis: Secondary | ICD-10-CM | POA: Diagnosis not present

## 2024-03-26 ENCOUNTER — Ambulatory Visit (HOSPITAL_BASED_OUTPATIENT_CLINIC_OR_DEPARTMENT_OTHER)
Admission: RE | Admit: 2024-03-26 | Discharge: 2024-03-26 | Disposition: A | Discharge status: Home / Self Care | Source: Ambulatory Visit

## 2024-03-26 ENCOUNTER — Ambulatory Visit

## 2024-03-26 ENCOUNTER — Other Ambulatory Visit (HOSPITAL_COMMUNITY)
Admission: RE | Admit: 2024-03-26 | Discharge: 2024-03-26 | Disposition: A | Discharge status: Home / Self Care | Source: Ambulatory Visit | Attending: Obstetrics and Gynecology | Admitting: Obstetrics and Gynecology

## 2024-03-26 ENCOUNTER — Other Ambulatory Visit: Payer: Self-pay

## 2024-03-26 VITALS — BP 130/80 | Ht 60.0 in | Wt 272.0 lb

## 2024-03-26 VITALS — BP 123/81 | HR 85 | Ht 60.0 in | Wt 254.0 lb

## 2024-03-26 DIAGNOSIS — Z113 Encounter for screening for infections with a predominantly sexual mode of transmission: Secondary | ICD-10-CM | POA: Diagnosis not present

## 2024-03-26 DIAGNOSIS — M79602 Pain in left arm: Secondary | ICD-10-CM

## 2024-03-26 DIAGNOSIS — M7702 Medial epicondylitis, left elbow: Secondary | ICD-10-CM

## 2024-03-26 DIAGNOSIS — N898 Other specified noninflammatory disorders of vagina: Secondary | ICD-10-CM

## 2024-03-26 DIAGNOSIS — Z202 Contact with and (suspected) exposure to infections with a predominantly sexual mode of transmission: Secondary | ICD-10-CM

## 2024-03-26 DIAGNOSIS — N939 Abnormal uterine and vaginal bleeding, unspecified: Secondary | ICD-10-CM | POA: Diagnosis not present

## 2024-03-26 DIAGNOSIS — M25522 Pain in left elbow: Secondary | ICD-10-CM | POA: Diagnosis not present

## 2024-03-26 LAB — POCT URINALYSIS DIPSTICK
Bilirubin, UA: NEGATIVE
Blood, UA: NEGATIVE
Glucose, UA: NEGATIVE
Nitrite, UA: NEGATIVE
Protein, UA: POSITIVE — AB
Spec Grav, UA: >=1.03 — AB (ref 1.010–1.025)
Urobilinogen, UA: 1 U/dL
pH, UA: 6 (ref 5.0–8.0)

## 2024-03-26 NOTE — Progress Notes (Addendum)
   Subjective:    Patient ID: Jennifer Andrews, female    DOB: 43 y.o., 1981/05/09   MRN: [id number]  Chief Complaint: Left arm pain  Discussed the use of AI scribe software for clinical note transcription with the patient, who gave verbal consent to proceed.  History of Present Illness Jennifer Andrews is a 43 year old right hand dominant female who presents with left elbow pain and stiffness.  Left elbow pain and stiffness - Onset one month ago - Inability to fully extend the left elbow - Tightening sensation in the middle of the elbow - Pain localized to the posterior and medial aspects of the elbow - Stiffness described as a need to 'pop' the elbow - Pain more pronounced on the medial side and tender to palpation in specific areas - Popping sensation with attempted extension, sometimes associated with tingling radiating to the fingers - Stiffness occasionally causes fingers to feel stuck, resolving after wrist movement - No prior history of elbow pain, trauma, or surgery - No persistent numbness or tingling down the arm - No fevers, chills, weight loss  Precipitating and aggravating factors - Symptoms began after a blood draw, with suspicion of a blown vein - Resting the elbow on surfaces and sleeping with the arm bent may contribute to discomfort  Alleviating factors and prior treatments - Ibuprofen  provides partial pain relief - Previous similar issue with the knee resolved with a steroid injection     Objective:   Vitals:   03/26/24 1111  BP: 130/80    Left elbow ( compared to normal ) Inspection: no swelling, no bony deformity or atrophy of the hypothenar region Palpation: TTP + medial epicondyle, - lateral epicondyle - olecranon AROM/PROM: Full flexion.  Supination and pronation both limited by approximately 5 degrees or so and are painful when pushing the terminal limits of these.  Terminal extension is also limited by approximately 5-10 degrees and is painful as  well. Strength: 5/5 flexion, 5/5 extension, 5/5 pronation, 5/5 supination Special: - pain or laxity with valgus force   Limited ultrasound examination of the left elbow: The common flexor bundle is well-visualized and appears to have an intratendinous calcification and small hypoechogenic fluid signal around its insertion. The common extensor bundle is well-visualized and appears normal. The posterior joint is well-visualized and appears to have a small amount of fluid intra-articularly deep to the fat pad. Anterior joint is well-visualized and appears to have a small volume of degenerative change. Interpretation: Mild elbow arthritis Medial epicondylitis    Assessment & Plan:   Assessment & Plan Left elbow pain and limited extension Patient has signs on physical exam and imaging consistent with golfers elbow with a calcification present in the common flexor tendon. Peculiarly, she also has a painful block to motion which prevents full extension in her elbow which is new for the past month despite no known inciting injury.  Feel confident that initiating an anti-inflammatory and physical therapy for the medial epicondylitis is appropriate, though my differential for her block to full extension in your elbow includes osteoarthritis, inflammatory arthritis, infection, capsular contracture, loose bodies, or bone or soft tissue tumor (though I could not appreciate any obvious physical block to motion on my limited ultrasound exam today).  Overall, we will start her cervical x-rays and may consider advanced imaging if pain worsens/persists in spite of anti-inflammatory and physical therapy.

## 2024-03-26 NOTE — Progress Notes (Signed)
 SUBJECTIVE:  43 y.o. female complains of thick jelly like clear vaginal discharge with streak of red, itchy for 1 day(s). Denies significant pelvic pain or fever.  Pt reports of unknown exposure to STD, has new partner would like STD testing. No UTI symptoms, however, would like urine tested.  LMP:03/15/2024  OBJECTIVE:  She appears well, afebrile. Urine dipstick: (+) Leukocytes, (+) Protein.   ASSESSMENT:  Vaginal Discharge Vaginal itching   PLAN:  GC, chlamydia, trichomonas, BVAG, CVAG probe sent to lab. Urine Cx sent. Treatment: To be determined once lab results are received ROV prn if symptoms persist or worsen.  Silvano LELON Piano, RN

## 2024-03-27 ENCOUNTER — Ambulatory Visit: Payer: Self-pay | Admitting: Obstetrics and Gynecology

## 2024-03-27 DIAGNOSIS — N3000 Acute cystitis without hematuria: Secondary | ICD-10-CM

## 2024-03-27 DIAGNOSIS — B3731 Acute candidiasis of vulva and vagina: Secondary | ICD-10-CM

## 2024-03-27 DIAGNOSIS — B9689 Other specified bacterial agents as the cause of diseases classified elsewhere: Secondary | ICD-10-CM

## 2024-03-27 LAB — CERVICOVAGINAL ANCILLARY ONLY
Bacterial Vaginitis (gardnerella): POSITIVE — AB
Candida Glabrata: NEGATIVE
Candida Vaginitis: POSITIVE — AB
Chlamydia: NEGATIVE
Comment: NEGATIVE
Comment: NEGATIVE
Comment: NEGATIVE
Comment: NEGATIVE
Comment: NEGATIVE
Comment: NORMAL
Neisseria Gonorrhea: NEGATIVE
Trichomonas: NEGATIVE

## 2024-03-27 LAB — RPR+HBSAG+HCVAB+...
HIV Screen 4th Generation wRfx: NONREACTIVE
Hep C Virus Ab: NONREACTIVE
Hepatitis B Surface Ag: NEGATIVE
RPR Ser Ql: NONREACTIVE

## 2024-03-27 MED ORDER — FLUCONAZOLE 150 MG PO TABS: 150.0000 mg | ORAL_TABLET | ORAL | 3 refills | Status: AC

## 2024-03-27 MED ORDER — METRONIDAZOLE 500 MG PO TABS: 500.0000 mg | ORAL_TABLET | Freq: Two times a day (BID) | ORAL | 0 refills | Status: AC

## 2024-03-29 LAB — URINE CULTURE

## 2024-03-30 MED ORDER — SULFAMETHOXAZOLE-TRIMETHOPRIM 800-160 MG PO TABS: 1.0000 | ORAL_TABLET | Freq: Two times a day (BID) | ORAL | 0 refills | Status: AC

## 2024-04-05 ENCOUNTER — Ambulatory Visit: Payer: Self-pay

## 2024-04-17 DIAGNOSIS — R21 Rash and other nonspecific skin eruption: Secondary | ICD-10-CM | POA: Diagnosis not present

## 2024-04-17 DIAGNOSIS — H1045 Other chronic allergic conjunctivitis: Secondary | ICD-10-CM | POA: Diagnosis not present

## 2024-04-17 DIAGNOSIS — J3081 Allergic rhinitis due to animal (cat) (dog) hair and dander: Secondary | ICD-10-CM | POA: Diagnosis not present

## 2024-04-17 DIAGNOSIS — J301 Allergic rhinitis due to pollen: Secondary | ICD-10-CM | POA: Diagnosis not present

## 2024-04-20 DIAGNOSIS — J3081 Allergic rhinitis due to animal (cat) (dog) hair and dander: Secondary | ICD-10-CM | POA: Diagnosis not present

## 2024-04-20 DIAGNOSIS — R21 Rash and other nonspecific skin eruption: Secondary | ICD-10-CM | POA: Diagnosis not present

## 2024-04-20 DIAGNOSIS — J301 Allergic rhinitis due to pollen: Secondary | ICD-10-CM | POA: Diagnosis not present

## 2024-04-20 DIAGNOSIS — H1045 Other chronic allergic conjunctivitis: Secondary | ICD-10-CM | POA: Diagnosis not present

## 2024-04-22 DIAGNOSIS — H1045 Other chronic allergic conjunctivitis: Secondary | ICD-10-CM | POA: Diagnosis not present

## 2024-04-22 DIAGNOSIS — J301 Allergic rhinitis due to pollen: Secondary | ICD-10-CM | POA: Diagnosis not present

## 2024-04-22 DIAGNOSIS — J3081 Allergic rhinitis due to animal (cat) (dog) hair and dander: Secondary | ICD-10-CM | POA: Diagnosis not present

## 2024-04-22 DIAGNOSIS — R21 Rash and other nonspecific skin eruption: Secondary | ICD-10-CM | POA: Diagnosis not present

## 2024-04-24 DIAGNOSIS — J302 Other seasonal allergic rhinitis: Secondary | ICD-10-CM | POA: Diagnosis not present

## 2024-04-24 DIAGNOSIS — L259 Unspecified contact dermatitis, unspecified cause: Secondary | ICD-10-CM | POA: Diagnosis not present

## 2024-04-24 DIAGNOSIS — Z6841 Body Mass Index (BMI) 40.0 and over, adult: Secondary | ICD-10-CM | POA: Diagnosis not present

## 2024-04-24 DIAGNOSIS — J301 Allergic rhinitis due to pollen: Secondary | ICD-10-CM | POA: Diagnosis not present

## 2024-04-24 DIAGNOSIS — J3081 Allergic rhinitis due to animal (cat) (dog) hair and dander: Secondary | ICD-10-CM | POA: Diagnosis not present

## 2024-04-24 DIAGNOSIS — Z7189 Other specified counseling: Secondary | ICD-10-CM | POA: Diagnosis not present

## 2024-04-24 DIAGNOSIS — H1045 Other chronic allergic conjunctivitis: Secondary | ICD-10-CM | POA: Diagnosis not present

## 2024-04-24 DIAGNOSIS — R21 Rash and other nonspecific skin eruption: Secondary | ICD-10-CM | POA: Diagnosis not present

## 2024-04-24 DIAGNOSIS — E7849 Other hyperlipidemia: Secondary | ICD-10-CM | POA: Diagnosis not present

## 2024-04-26 ENCOUNTER — Ambulatory Visit

## 2024-05-15 ENCOUNTER — Other Ambulatory Visit: Payer: Self-pay

## 2024-05-15 DIAGNOSIS — M25622 Stiffness of left elbow, not elsewhere classified: Secondary | ICD-10-CM

## 2024-05-15 DIAGNOSIS — M79602 Pain in left arm: Secondary | ICD-10-CM

## 2024-05-15 DIAGNOSIS — M25522 Pain in left elbow: Secondary | ICD-10-CM

## 2024-05-15 DIAGNOSIS — M24129 Other articular cartilage disorders, unspecified elbow: Secondary | ICD-10-CM

## 2024-05-15 NOTE — Progress Notes (Signed)
 After further discussion with the patient, she has confirmed at least 6 weeks of home exercises and still continues to have intermittent inability to fully extend her elbow with concordant pain. Given her xray which was read by radiology as normal but per my review appears to potentially have evidence of a loose body present with a faint intra-articular opacity seen, I feel an MRI is indicated to better assess for a loose body within joint causing mechanical block to motion.  Redell Robes, DO CAQSM

## 2024-06-16 ENCOUNTER — Ambulatory Visit (HOSPITAL_BASED_OUTPATIENT_CLINIC_OR_DEPARTMENT_OTHER)

## 2024-06-23 ENCOUNTER — Ambulatory Visit (HOSPITAL_BASED_OUTPATIENT_CLINIC_OR_DEPARTMENT_OTHER)

## 2024-07-09 ENCOUNTER — Encounter: Admitting: Family Medicine
